# Patient Record
Sex: Male | Born: 1975 | Hispanic: Yes | Marital: Married
Health system: Southern US, Community
[De-identification: ages and names within clinical notes are randomized; demographics above are authoritative.]

## PROBLEM LIST (undated history)

## (undated) DIAGNOSIS — K921 Melena: Secondary | ICD-10-CM

## (undated) DIAGNOSIS — F329 Major depressive disorder, single episode, unspecified: Secondary | ICD-10-CM

## (undated) DIAGNOSIS — E538 Deficiency of other specified B group vitamins: Secondary | ICD-10-CM

## (undated) DIAGNOSIS — F32A Depression, unspecified: Secondary | ICD-10-CM

## (undated) DIAGNOSIS — A630 Anogenital (venereal) warts: Secondary | ICD-10-CM

## (undated) DIAGNOSIS — M459 Ankylosing spondylitis of unspecified sites in spine: Secondary | ICD-10-CM

## (undated) DIAGNOSIS — B019 Varicella without complication: Secondary | ICD-10-CM

## (undated) HISTORY — DX: Major depressive disorder, single episode, unspecified: F32.9

## (undated) HISTORY — DX: Anogenital (venereal) warts: A63.0

## (undated) HISTORY — DX: Deficiency of other specified B group vitamins: E53.8

## (undated) HISTORY — DX: Varicella without complication: B01.9

## (undated) HISTORY — DX: Ankylosing spondylitis of unspecified sites in spine: M45.9

## (undated) HISTORY — DX: Melena: K92.1

## (undated) HISTORY — DX: Depression, unspecified: F32.A

---

## 2007-12-26 ENCOUNTER — Emergency Department (HOSPITAL_COMMUNITY): Admission: EM | Admit: 2007-12-26 | Discharge: 2007-12-26 | Payer: Self-pay | Admitting: Emergency Medicine

## 2007-12-28 ENCOUNTER — Ambulatory Visit: Payer: Self-pay | Admitting: Cardiology

## 2007-12-28 ENCOUNTER — Ambulatory Visit: Payer: Self-pay

## 2007-12-29 ENCOUNTER — Ambulatory Visit: Payer: Self-pay

## 2008-01-30 ENCOUNTER — Ambulatory Visit: Payer: Self-pay | Admitting: Internal Medicine

## 2008-01-30 DIAGNOSIS — R030 Elevated blood-pressure reading, without diagnosis of hypertension: Secondary | ICD-10-CM | POA: Insufficient documentation

## 2008-01-30 DIAGNOSIS — F411 Generalized anxiety disorder: Secondary | ICD-10-CM | POA: Insufficient documentation

## 2008-01-30 DIAGNOSIS — M459 Ankylosing spondylitis of unspecified sites in spine: Secondary | ICD-10-CM

## 2008-01-30 DIAGNOSIS — H8110 Benign paroxysmal vertigo, unspecified ear: Secondary | ICD-10-CM

## 2008-01-30 DIAGNOSIS — F329 Major depressive disorder, single episode, unspecified: Secondary | ICD-10-CM

## 2008-01-30 DIAGNOSIS — F3289 Other specified depressive episodes: Secondary | ICD-10-CM | POA: Insufficient documentation

## 2008-02-14 ENCOUNTER — Encounter: Payer: Self-pay | Admitting: Internal Medicine

## 2008-02-14 ENCOUNTER — Encounter: Admission: RE | Admit: 2008-02-14 | Discharge: 2008-05-14 | Payer: Self-pay | Admitting: Internal Medicine

## 2008-04-08 ENCOUNTER — Ambulatory Visit: Payer: Self-pay | Admitting: Internal Medicine

## 2008-05-14 ENCOUNTER — Encounter: Payer: Self-pay | Admitting: Internal Medicine

## 2008-05-17 ENCOUNTER — Ambulatory Visit: Payer: Self-pay | Admitting: Cardiology

## 2008-08-08 ENCOUNTER — Ambulatory Visit: Payer: Self-pay | Admitting: Internal Medicine

## 2008-08-12 ENCOUNTER — Ambulatory Visit: Payer: Self-pay | Admitting: Internal Medicine

## 2008-08-12 LAB — CONVERTED CEMR LAB
ALT: 30 units/L (ref 0–53)
Bilirubin, Direct: 0.1 mg/dL (ref 0.0–0.3)
CO2: 30 meq/L (ref 19–32)
Calcium: 8.9 mg/dL (ref 8.4–10.5)
HDL: 41 mg/dL (ref >39.0)
Sodium: 143 meq/L (ref 135–145)
TSH: 2.85 microintl units/mL (ref 0.35–5.50)
Total Bilirubin: 0.7 mg/dL (ref 0.3–1.2)
Total CHOL/HDL Ratio: 3.9

## 2009-01-16 ENCOUNTER — Ambulatory Visit: Payer: Self-pay | Admitting: Internal Medicine

## 2009-01-16 DIAGNOSIS — B070 Plantar wart: Secondary | ICD-10-CM

## 2010-02-23 ENCOUNTER — Encounter: Payer: Self-pay | Admitting: Internal Medicine

## 2010-02-24 ENCOUNTER — Encounter: Payer: Self-pay | Admitting: Internal Medicine

## 2010-02-24 LAB — CONVERTED CEMR LAB
Cholesterol: 169 mg/dL (ref 0–200)
HDL: 64 mg/dL (ref >39)
Total CHOL/HDL Ratio: 2.6

## 2010-03-06 ENCOUNTER — Ambulatory Visit: Payer: Self-pay | Admitting: Internal Medicine

## 2010-03-06 DIAGNOSIS — E538 Deficiency of other specified B group vitamins: Secondary | ICD-10-CM

## 2010-03-06 DIAGNOSIS — L989 Disorder of the skin and subcutaneous tissue, unspecified: Secondary | ICD-10-CM | POA: Insufficient documentation

## 2010-03-06 DIAGNOSIS — R799 Abnormal finding of blood chemistry, unspecified: Secondary | ICD-10-CM

## 2010-03-31 LAB — CONVERTED CEMR LAB
BUN: 17 mg/dL (ref 6–23)
CO2: 25 meq/L (ref 19–32)
Calcium: 9.3 mg/dL (ref 8.4–10.5)
Creatinine, Ser: 0.96 mg/dL (ref 0.40–1.50)
Glucose, Bld: 85 mg/dL (ref 70–99)

## 2010-04-01 ENCOUNTER — Encounter: Payer: Self-pay | Admitting: Internal Medicine

## 2010-08-18 NOTE — Miscellaneous (Signed)
Summary: Lab Orders  Clinical Lists Changes  Orders: Added new Test order of T-Lipid Profile [phone]) - Signed Added new Test order of T-C-Reactive Protein [phone]) - Signed

## 2010-08-18 NOTE — Assessment & Plan Note (Signed)
Summary: mole on chest & high creatine reading at other office/dt   Vital Signs:  Patient profile:   35 year old male Weight:      194.25 pounds BMI:     27.19 Temp:     98.1 degrees F oral Pulse rate:   62 / minute Pulse rhythm:   regular Resp:     16 per minute BP sitting:   120 / 90  (right arm) Cuff size:   large  Vitals Entered By: Glendell Docker CMA (March 06, 2010 10:29 AM) CC: follow-up visit Comments lab work done through [facility] and was informed that his creatinine level was elevated and he was advised to stopped taking nsaids, had been drinking protein shakes prior to blood draw,     Primary Care Provider:  Dondra Spry DO  CC:  follow-up visit.  History of Present Illness: 35 y/o with hx of AS for f/u 2 weeks ago - rheum noted slightly elevated Cr. stopped taking NSAIDs in May drinks protein drinks after exercising also occ took stacker pills for wt loss     Allergies: No Known Drug Allergies  Past History:  Past Medical History: Ankylosing spondylitis Depression  History of hematochezia - negative workup including EGD and colonoscopy History of genital warts   Childhood chickenpox   ? hx of B12 deficiency  Family History: Mother is age 36-healthy Father is age 29-hypertension, ankylosing spondylitis, alcoholism brother and sister-both healthy denies family history of cancer PGM - diabetes     Review of Systems       occ loose stools, flatulence  Physical Exam  General:  alert, well-developed, and well-nourished.   Lungs:  normal respiratory effort and normal breath sounds.   Heart:  normal rate, regular rhythm, and no gallop.   Skin:  raised  2 mm hyperpigmented lesion right anterior shin, sebaceous cyst - underneath right breast Psych:  normally interactive and good eye contact.     Impression & Recommendations:  Problem # 1:  SKIN LESION (ICD-709.9)  Orders: Dermatology Referral (Derma)  Problem # 2:  OTHER NONSPECIFIC  FINDINGS EXAMINATION OF BLOOD (ICD-790.99) pt notes Cr higher when checked by rheumatologist I suspect Cr rise from protein supplements pt advised to stop all OTC supplements repeat Cr  Complete Medication List: 1)  Etodolac 400 Mg Tabs (Etodolac) .... Take 1 tablet by mouth once a day( hold) 2)  Omeprazole 20 Mg Cpdr (Omeprazole) .... Take 1 tablet by mouth once a day 3)  Osteo Bi-flex Adv Joint Shield Tabs (Misc natural products) .... Take 1 tablet by mouth two times a day 4)  Vitamin C 500 Mg Tabs (Ascorbic acid) .... Take 4 to 6 tablet by mouth once a day 5)  Clonazepam 0.5 Mg Tabs (Clonazepam) .... 1/2 tablet by mouth two times a day 6)  Methylprednisolone 4 Mg Tabs (Methylprednisolone) .... Take 1 tablet by mouth two times a day as needed 7)  Fish Oil 1000 Mg Caps (Omega-3 fatty acids) .... Take 2 capsule by mouth at bedtime 8)  Vitamin B-12 1000 Mcg Tabs (Cyanocobalamin) .... Take 1 tablet by mouth once a day  Other Orders: T-Vitamin B12 ([id number]) T-Basic Metabolic Panel ([id number])  Patient Instructions: 1)  Take probiotic - Accuflora or Align 2)  We will contact you re:  blood test results

## 2010-08-18 NOTE — Letter (Signed)
   [place] [facility] [address] [place], [place]  [address]  [place] Phone: [phone]      April 01, 2010   PINCHUS Cegielski [address] [place], [place] [address]  RE:  LAB RESULTS  Dear  Mr. CADDEN,  The following is an interpretation of your most recent lab tests.  Please take note of any instructions provided or changes to medications that have resulted from your lab work.  ELECTROLYTES:  Good - no changes needed    B12 levels - normal       Sincerely Yours,    Dr. Thomos Lemons  Appended Document:  mailed

## 2010-12-01 NOTE — Assessment & Plan Note (Signed)
[facility]                            CARDIOLOGY OFFICE NOTE   NAME:Barre, DELONTA                        MRN:          [id number]  DATE:05/17/2008                            DOB:          June 02, 1976    PRIMARY CARE PHYSICIAN:  Barbette Hair. Artist Pais, DO   REASON FOR PRESENTATION:  Evaluate the patient with chest pain and  palpitations.   HISTORY OF PRESENT ILLNESS:  I saw the patient back in June for the  above complaints.  At that time, I did send him for stress perfusion  study which demonstrated no evidence of ischemia or infarct.  There was  some mild anterior attenuation, which most likely represented artifact.  Since that time, he was found to have mold in his apartment.  He got out  of that situation so his symptoms have almost completely resolved.  He  has been active and able to do exercise without any symptoms.  He has a  little vertigo, which is being managed.  He has had no further  palpitations, presyncope, or syncope.  He has had no chest pain or  shortness of breath.   PAST MEDICAL HISTORY:  Adjustment disorder diagnosed in the [employer] (he had  a medical discharge), ankylosing spondylitis, borderline hypertension,  and previous panic attacks.   ALLERGIES:  Benzo lido cream.   REVIEW OF SYSTEMS:  As stated in the HPI and otherwise negative for  other systems.   PHYSICAL EXAMINATION:  GENERAL:  The patient is pleasant in no distress.  VITAL SIGNS:  Blood pressure 139/80, heart rate 53 and regular, weight  225 pounds, and body mass index 30.  NECK:  No jugular venous distention at 45 degrees; carotid upstroke  brisk and symmetrical; no bruits; no thyromegaly.  LYMPHATICS:  No  adenopathy.  LUNGS:  Clear to auscultation bilaterally.  BACK:  No costovertebral angle tenderness.  CHEST:  Unremarkable.  HEART:  PMI not displaced or sustained; S1 and S2 within normal limits;  no S3, no S4; no clicks, rubs, or murmurs.  ABDOMEN:  Obese;  positive  bowel sounds; normal in frequency and pitch; no bruits, rebound,  guarding, or midline pulsatile mass; no hepatomegaly; no splenomegaly.  SKIN:  No rashes; no nodules.  EXTREMITIES:  Pulses 2+ throughout; no edema, cyanosis, or clubbing.  NEURO:  Oriented to person, place, and time; cranial nerves II-XII  grossly intact; motor grossly intact.   ASSESSMENT AND PLAN:  1. Palpitations.  He is no longer problematic and may have been      related to the Mold in his apartment.  No further cardiovascular      testing is suggested.  2. Chest discomfort.  He had a negative stress perfusion study.  He is      no longer having any symptoms.  No further workup is planned.  3. Hypertension.  He has been keeping his blood pressure in check and      it has been well-controlled.  He is following Dr. Artist Pais.  4. Followup.  We will see the patient back as needed.  Rollene Rotunda, MD, [facility]  Electronically Signed    JH/MedQ  DD: 05/17/2008  DT: 05/17/2008  Job #: [id number]   cc:   Barbette Hair. Artist Pais, DO

## 2010-12-01 NOTE — Assessment & Plan Note (Signed)
[facility]                            CARDIOLOGY OFFICE NOTE   NAME:Ryan Cabrera, Ryan Cabrera                        MRN:          [id number]  DATE:12/28/2007                            DOB:          [birth year]    REFERRING PHYSICIAN:  Doug Sou, M.D.   REASON FOR CONSULTATION:  Evaluate patient with chest pain and  palpitations.   HISTORY OF PRESENT ILLNESS:  The patient is a pleasant 35 year old  gentleman with a past history of panic attacks, but no cardiac history.  He did report an ultrasound some years ago while in the [employer].  He  has had no further cardiovascular testing since that time or any  significant problems until Monday.  Monday, while playing the drums  (this appears to be very a aerobic activity when he does it),  he became  very short of breath.  He had chest discomfort, like someone sitting on  him.  This was moderately intense and lasted for about 2 minutes.  He  was already diaphoretic.  He did not become nauseated.  Later that day,  he felt quite fatigued and lightheaded.  He had had an episode like this  some years before.  All of Tuesday he felt weak.  He has had a couple of  episodes again since that time.  He had one on Tuesday of chest  tightness and feeling very anxious and having a sense of dread that  caused him to go to the ER.  There, he was noted to have negative  cardiac enzymes.  His EKG demonstrated some sinus arrhythmia, but no  other changes.  One set of point of care markers was negative.  He was  referred here for his cardiology appointment.   He said he felt dizzy all day yesterday.  He had one episode of his  heart beating hard today.  This lasted for about 90 seconds.  He had had  one more episode of chest tightness lasting for several seconds.  These  occur at rest, but he is not able to bring these on.  He is not sure  whether this is similar to his previous panic, but if it is, he thinks  it is more  intense.  He has been active, playing the drums and swimming  3 times a week, and has not been able to bring on these symptoms  recently.  Has not had any new shortness of breath, PND or orthopnea.  He is not having any presyncope or syncope.  He is not having any neck  or arm discomfort.  Has had no radiation of the current symptoms.  He  has had quite a bit of anxiety and was tearful in the office related to  this.   PAST MEDICAL HISTORY:  1. Adjustment disorder diagnosed in the [employer] (he had a medical      discharge).  2. Ankylosing spondylitis.  3. Borderline hypertension.  4. Previous panic attacks.   PAST SURGICAL HISTORY:  None.   ALLERGIES:  BENZYL LIDOCREAM.   MEDICATIONS:  Sulfasalazine 1000 mg b.i.d.   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  He is married and has one 93-year-old.  He recently moved  from [place] as his wife got a job here.  He smokes cigars.  He  quit smoking in 1998.  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  However, he has not done this since he was a teenager.  He drinks  alcohol socially.   FAMILY HISTORY:  Contributory for father having ankylosing spondylitis  and there being hypertension in both his parents.  Otherwise, there is  no family history of early coronary disease, arrhythmias, sudden death,  heart failure.   REVIEW OF SYSTEMS:  Positive for dizziness when he comes up from a yoga  pose.  He says he gets nauseated and feels quite bad for hours after  this.  Otherwise, as stated in the HPI, negative for all other systems.   PHYSICAL EXAMINATION:  The patient is in no distress but he is anxious.  Blood pressure 151/90, heart 77 regular, weight 220 pounds, body mass  index 30.  HEENT:  Eyelids unremarkable, pupils equal, round and reactive to light,  fundi not visualized, oral mucosa unremarkable.  NECK:  No jugular venous distention at 45 degrees, carotid upstroke  brisk  and symmetrical.  No bruits, no thyromegaly.  LYMPHATICS:  No cervical, axillary, or inguinal adenopathy.  LUNGS:  Clear to auscultation bilaterally.  BACK:  No costovertebral angle tenderness.  CHEST:  Unremarkable.  HEART:  PMI not displaced or sustained, S1-S2 within normal limits.  No  S3-S4. No clicks, no rubs, no murmurs.  ABDOMEN:  Mildly obese, positive bowel sounds normal in frequency and  pitch, no bruits, no rebound,  no guarding or midline pulsatile mass.  No hepatomegaly, no splenomegaly.  SKIN:  No rashes, no nodules.  EXTREMITIES:  2+ pulses throughout, no edema,  no cyanosis or clubbing.  NEURO:  Oriented to person, place, and time.  Cranial nerves II-XII  grossly intact, motor grossly intact.   ASSESSMENT/PLAN:  1. Chest pain:  The patient has symptoms that have atypical, but also      some typical features for unstable angina.  I think the pretest      probability is moderate.  Given this, screening with a stress      perfusion study is indicated.  I have described this to the patient      and he agrees to proceed.  2. Palpitations:  This is the other cardiac etiology that could cause      all the symptoms described.  He is going to wear a 2-week event      monitor.  When he comes back for a stress test, we will get a TSH      as well.  We did do orthostatics in the office and he did not drop      his blood pressure.  3. Anxiety:  I do think the patient has a component of this.  This can      be managed by his primary care physician.  Panic causing these      symptoms is a diagnosis of exclusion.  4. Hypertension.  Blood pressure is slightly elevated.  He can keep an      eye on this and we will check it going forward and have an      assessment of it when he does his stress test as well.  He will  need therapeutic lifestyle changes (TLC) to include some weight      loss to assist with this.  Blood pressure was lower when we      repeated it with the  orthostatics later during the office visit.  5. Weight.  The patient understands he  needs to lose weight with diet      and exercise.  6. Followup: I will see the patient back in 1 month or sooner if      needed.     Rollene Rotunda, MD, [facility]  Electronically Signed    JH/MedQ  DD: 12/28/2007  DT: 12/28/2007  Job #: [id number]   cc:   Doug Sou, M.D.

## 2010-12-01 NOTE — Assessment & Plan Note (Signed)
[facility]                                 ON-CALL NOTE   NAME:Ryan Cabrera, Ryan Cabrera                        MRN:          [id number]  DATE:12/26/2007                            DOB:          05-09-1976    TELEPHONE CONTACT NOTE:  Dated December 26, 2007   I was contacted by Dr. Ethelda Chick with the emergency room, stating that  he had Mr. [name] there who had had two episodes of chest pain  and shortness of breath, associated with exertion.  He stated that Mr.  [name] currently appears stable and his EKG as well as cardiac markers  were within normal limits.  He felt that Mr. [name] should be seen by  cardiology.  I advised him that I would be happy to arrange with our  office for him to be evaluated as an outpatient and Dr. Ethelda Chick felt  like that was an appropriate plan of care.  Of note, point of care  markers were negative times one and a CBC and BMET showed all values  within normal limits.  Additionally, a chest x-ray showed bibasilar  atelectasis, but no acute cardiopulmonary process.  A message has been  left with the office.      Theodore Demark, PA-C  Electronically Signed      Rollene Rotunda, MD, [facility]  Electronically Signed   RB/MedQ  DD: 12/26/2007  DT: 12/26/2007  Job #: [id number]

## 2010-12-04 NOTE — Letter (Signed)
January 04, 2008     To whom it may concern:   RE:  PRIYANSH, PRY  MRN:  [id number]  /  DOB:  03-09-1976   Mr. Say recently had an event monitor placed.  This was ordered to  evaluate palpitations.  However, he wore this monitor early, and I do  not plan on reviewing any of the strips.  He was not aware that this  would not be approved 100% coverage and cannot afford the monitor.  Therefore, I will not bill for reading.  The patient understands this.    Sincerely,      Rollene Rotunda, MD, [facility]  Electronically Signed    JH/MedQ  DD: 01/04/2008  DT: 01/05/2008  Job #: [id number]

## 2011-02-03 ENCOUNTER — Encounter: Payer: Self-pay | Admitting: Internal Medicine

## 2011-02-03 ENCOUNTER — Encounter: Payer: Self-pay | Admitting: Emergency Medicine

## 2011-02-03 ENCOUNTER — Inpatient Hospital Stay (INDEPENDENT_AMBULATORY_CARE_PROVIDER_SITE_OTHER)
Admission: RE | Admit: 2011-02-03 | Discharge: 2011-02-03 | Disposition: A | Discharge status: Home / Self Care | Payer: 59 | Source: Ambulatory Visit | Attending: Emergency Medicine | Admitting: Emergency Medicine

## 2011-02-03 DIAGNOSIS — H60339 Swimmer's ear, unspecified ear: Secondary | ICD-10-CM | POA: Insufficient documentation

## 2011-02-04 ENCOUNTER — Ambulatory Visit (INDEPENDENT_AMBULATORY_CARE_PROVIDER_SITE_OTHER): Payer: 59 | Admitting: Internal Medicine

## 2011-02-04 ENCOUNTER — Encounter: Payer: Self-pay | Admitting: Internal Medicine

## 2011-02-04 DIAGNOSIS — H60399 Other infective otitis externa, unspecified ear: Secondary | ICD-10-CM

## 2011-02-04 DIAGNOSIS — H6090 Unspecified otitis externa, unspecified ear: Secondary | ICD-10-CM

## 2011-02-04 DIAGNOSIS — T753XXA Motion sickness, initial encounter: Secondary | ICD-10-CM

## 2011-02-04 MED ORDER — SCOPOLAMINE 1 MG/3DAYS TD PT72: MEDICATED_PATCH | TRANSDERMAL | Status: DC

## 2011-02-04 MED ORDER — AMOXICILLIN 875 MG PO TABS: 875.0000 mg | ORAL_TABLET | Freq: Two times a day (BID) | ORAL | Status: DC

## 2011-02-04 MED ORDER — AMOXICILLIN 875 MG PO TABS: 875.0000 mg | ORAL_TABLET | Freq: Two times a day (BID) | ORAL | Status: AC

## 2011-02-04 MED ORDER — SCOPOLAMINE 1 MG/3DAYS TD PT72: MEDICATED_PATCH | TRANSDERMAL | Status: AC

## 2011-02-06 DIAGNOSIS — H6090 Unspecified otitis externa, unspecified ear: Secondary | ICD-10-CM | POA: Insufficient documentation

## 2011-02-06 DIAGNOSIS — T753XXA Motion sickness, initial encounter: Secondary | ICD-10-CM | POA: Insufficient documentation

## 2011-02-06 NOTE — Assessment & Plan Note (Signed)
Discussed prophylaxis. Provide scopolamine patches. Instructions for use provided.

## 2011-02-06 NOTE — Progress Notes (Signed)
  Subjective:    Patient ID: Ryan Cabrera, male    DOB: [birth year], 35 y.o.   MRN: [id number]  HPI patient presents to clinic to establish medical care and for evaluation of ear pain. This recently has had approximately one-week history of right ear pain without drainage injury or trauma. No associated fever or chills. Attempted to irrigate the ear at home without success or improvement. Presented to urgent care yesterday and diagnosed with otitis externa and was placed on antibiotic with steroid drops. Pain is moderate severity. No exacerbating or alleviating factors. Has intermittent vertigo and plans to take a cruise in approximately one month. Has attempted Dramamine in the past for seasickness with no improvement. Has ankylosing spondylitis controlled conservatively and followed by rheumatology. No other complaint.  Reviewed past medical history, past surgical history, medications, allergies, social history, family history    Review of Systems  Constitutional: Negative for fever and chills.  HENT: Positive for ear pain. Negative for facial swelling, neck pain and ear discharge.   All other systems reviewed and are negative.       Objective:   Physical Exam  Nursing note and vitals reviewed. Constitutional: He appears well-developed and well-nourished. No distress.  HENT:  Head: Normocephalic and atraumatic.  Right Ear: Hearing and tympanic membrane normal. No lacerations. There is swelling and tenderness. No foreign bodies.  Left Ear: Hearing, tympanic membrane, external ear and ear canal normal.  Nose: Nose normal.  Mouth/Throat: Oropharynx is clear and moist. No oropharyngeal exudate.       Edematous canal with mild white to yellow exudate  Eyes: Conjunctivae are normal. Right eye exhibits no discharge. Left eye exhibits no discharge. No scleral icterus.  Neck: Normal range of motion. Neck supple.  Neurological: He is alert.  Skin: Skin is warm and dry. He is not diaphoretic.    Psychiatric: He has a normal mood and affect. His behavior is normal.          Assessment & Plan:

## 2011-02-06 NOTE — Assessment & Plan Note (Signed)
Add oral antibiotic amoxicillin 875 mg b.i.d. For 7 days. Continue antibiotic steroid drops.Followup if no improvement or worsening.

## 2011-04-15 LAB — BASIC METABOLIC PANEL
BUN: 10
Calcium: 9.6
GFR calc non Af Amer: >60
Glucose, Bld: 90

## 2011-04-15 LAB — POCT CARDIAC MARKERS
CKMB, poc: 1.6
Operator id: 231701
Troponin i, poc: <0.05

## 2011-04-15 LAB — DIFFERENTIAL
Basophils Absolute: 0
Basophils Relative: 0
Eosinophils Relative: 0
Lymphocytes Relative: 24
Neutro Abs: 4.2

## 2011-04-15 LAB — CBC
Platelets: 185
RDW: 13

## 2011-06-18 ENCOUNTER — Encounter: Payer: Self-pay | Admitting: Internal Medicine

## 2011-06-18 ENCOUNTER — Ambulatory Visit (INDEPENDENT_AMBULATORY_CARE_PROVIDER_SITE_OTHER): Payer: 59 | Admitting: Internal Medicine

## 2011-06-18 VITALS — BP 120/60 | HR 46 | Temp 98.5°F | Resp 18 | Wt 186.0 lb

## 2011-06-18 DIAGNOSIS — H60399 Other infective otitis externa, unspecified ear: Secondary | ICD-10-CM

## 2011-06-18 DIAGNOSIS — H6090 Unspecified otitis externa, unspecified ear: Secondary | ICD-10-CM

## 2011-06-18 NOTE — Progress Notes (Signed)
  Subjective:    Patient ID: Ryan Cabrera, male    DOB: August 13, 1975, 35 y.o.   MRN: [id number]  HPI  Pt presents to clinic for evaluation of left ear pain. Notes several day h/o left ear pain without injury, f/c, or drainage. Has not felt sick. 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 Had cortisporin otic gtts leftover at home and attempted a few doses with improvement. Did recently change to new ear plugs which did not work well. No other complaints.  Past Medical History  Diagnosis Date  . Ankylosing spondylitis   . Depression   . Hematochezia     neg workup including EGD and colonoscopy  . Genital warts   . Chicken pox     childhood  . B12 deficiency     history of   No past surgical history on file.  reports that he has been smoking Cigars.  He has never used smokeless tobacco. He reports that he drinks alcohol. His drug history not on file. family history includes Alcohol abuse in his father; Ankylosing spondylitis in his father; Diabetes in his paternal grandmother; and Hypertension in his father.  There is no history of Cancer. Allergies  Allergen Reactions  . Molds & Smuts   . Sulfasalazine Other (See Comments)    Abdominal pain. Nausea  . Sulfonamide Derivatives      Review of Systems see hpi     Objective:   Physical Exam  Nursing note and vitals reviewed. Constitutional: He appears well-developed and well-nourished. No distress.  HENT:  Head: Normocephalic and atraumatic.  Right Ear: Tympanic membrane, external ear and ear canal normal.  Left Ear: Tympanic membrane and external ear normal.       Minimal left ear canal erythema  Eyes: Conjunctivae are normal. No scleral icterus.  Neurological: He is alert.  Skin: Skin is warm and dry. He is not diaphoretic.  Psychiatric: He has a normal mood and affect.          Assessment & Plan:

## 2011-06-18 NOTE — Assessment & Plan Note (Signed)
Minimal erythema. Findings not definitive for OE. Attempt cortisporin otic gtts for brief 3 day course. Followup if no improvement or worsening.

## 2011-06-21 NOTE — Progress Notes (Signed)
Summary: right ear ache (room 5)   Vital Signs:  Patient Profile:   35 Years Old Male CC:      right ear congestions/pain intermittant x 3 weeks Height:     71 inches Weight:      184 pounds O2 Sat:      99 % O2 treatment:    Room Air Temp:     98.8 degrees F oral Pulse rate:   49 / minute Resp:     16 per minute BP sitting:   128 / 72  Vitals Entered By: Lavell Islam RN (February 03, 2011 2:11 PM)                  Updated Prior Medication List: OMEPRAZOLE 20 MG CPDR (OMEPRAZOLE) Take 1 tablet by mouth once a day VITAMIN C 500 MG TABS (ASCORBIC ACID) Take 4 to 6 tablet by mouth once a day FISH OIL 1000 MG CAPS (OMEGA-3 FATTY ACIDS) Take 2 capsule by mouth at bedtime VITAMIN B-12 1000 MCG TABS (CYANOCOBALAMIN) Take 1 tablet by mouth once a day  Current Allergies: ! SULFA ! * MOLDSHistory of Present Illness Chief Complaint: right ear congestions/pain intermittant x 3 weeks History of Present Illness: R ear congestion, pain, intermittantly for 3 weeks.  He is an avid swimmer and swims in a pool 4x per week.  He also wears earphone when working out.  He has earbuds when he swims.  No F/C/N/V.  No URI symptoms, ST, runny nose, or other symptoms.  He tried OTC earwax drops but it didn't help.  REVIEW OF SYSTEMS Constitutional Symptoms      Denies fever, chills, night sweats, weight loss, weight gain, and fatigue.  Eyes       Denies change in vision, eye pain, eye discharge, glasses, contact lenses, and eye surgery. Ear/Nose/Throat/Mouth       Complains of ear pain.      Denies hearing loss/aids, change in hearing, ear discharge, dizziness, frequent runny nose, frequent nose bleeds, sinus problems, sore throat, hoarseness, and tooth pain or bleeding.      Comments: right Respiratory       Denies dry cough, productive cough, wheezing, shortness of breath, asthma, bronchitis, and emphysema/COPD.  Cardiovascular       Denies murmurs, chest pain, and tires easily with exhertion.     Gastrointestinal       Complains of diarrhea.      Denies stomach pain, nausea/vomiting, constipation, blood in bowel movements, and indigestion. Genitourniary       Denies painful urination, kidney stones, and loss of urinary control. Neurological       Denies paralysis, seizures, and fainting/blackouts. Musculoskeletal       Denies muscle pain, joint pain, joint stiffness, decreased range of motion, redness, swelling, muscle weakness, and gout.  Skin       Denies bruising, unusual mles/lumps or sores, and hair/skin or nail changes.  Psych       Denies mood changes, temper/anger issues, anxiety/stress, speech problems, depression, and sleep problems. 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 Ankylosing spondylitis Depression  History of hematochezia - negative workup including EGD and colonoscopy History of genital warts   Childhood chickenpox   ? hx of B12 deficiency  Family History: Reviewed history from 03/06/2010 and no changes required. Mother is age 6-healthy Father is age 72-hypertension, ankylosing spondylitis, alcoholism brother and sister-both healthy denies family history of cancer PGM - diabetes    Family History Diabetes  Social History: Reviewed history from 01/16/2009 and no changes required. 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 57 years old Alcohol use-yes (rare) Quit smoking in  1998-smoked for 9 years 1 1/2 packs per day    Physical Exam General appearance: well developed, well nourished, no acute distress Ears: R ear canal is swollen and erythematous, L ear normal other than minimal cerumen Nasal: mucosa pink, nonedematous, no septal deviation, turbinates normal Oral/Pharynx: tongue normal, posterior pharynx without erythema or exudate Chest/Lungs: no rales, wheezes, or rhonchi bilateral, breath sounds equal without effort Heart: regular rate and  rhythm, no murmur MSE: oriented to time, place, and person Assessment New Problems: ACUTE SWIMMERS EAR (ICD-380.12) FAMILY HISTORY DIABETES 1ST DEGREE RELATIVE (ICD-V18.0)   Plan New Medications/Changes: NEOMYCIN-POLYMYXIN-HC 3.5-10000-1 SOLN (NEOMYCIN-POLYMYXIN-HC) 4 gtts R ear two times a day for 1 week  #QS x 0, 02/03/2011, Hoyt Koch MD  New Orders: New Patient Level II [phone] Planning Comments:   Placed on ABX/HC eardrops.  Try to avoid swimming and Qtips and earphone this week until the ear is improved.  Follow-up with your primary care physician if not improving or if getting worse   The patient and/or caregiver has been counseled thoroughly with regard to medications prescribed including dosage, schedule, interactions, rationale for use, and possible side effects and they verbalize understanding.  Diagnoses and expected course of recovery discussed and will return if not improved as expected or if the condition worsens. Patient and/or caregiver verbalized understanding.  Prescriptions: NEOMYCIN-POLYMYXIN-HC 3.5-10000-1 SOLN (NEOMYCIN-POLYMYXIN-HC) 4 gtts R ear two times a day for 1 week  #QS x 0   Entered and Authorized by:   Hoyt Koch MD   Signed by:   Hoyt Koch MD on 02/03/2011   Method used:   Print then Give to Patient   RxID:   [id number]   Orders Added: 1)  New Patient Level II [52841]

## 2011-08-31 ENCOUNTER — Ambulatory Visit (INDEPENDENT_AMBULATORY_CARE_PROVIDER_SITE_OTHER): Payer: 59 | Admitting: Internal Medicine

## 2011-08-31 ENCOUNTER — Encounter: Payer: Self-pay | Admitting: Internal Medicine

## 2011-08-31 VITALS — BP 100/70 | HR 45 | Temp 98.4°F | Resp 16 | Ht 71.0 in | Wt 181.0 lb

## 2011-08-31 DIAGNOSIS — R102 Pelvic and perineal pain: Secondary | ICD-10-CM

## 2011-08-31 DIAGNOSIS — R300 Dysuria: Secondary | ICD-10-CM

## 2011-08-31 MED ORDER — CIPROFLOXACIN HCL 500 MG PO TABS: 500.0000 mg | ORAL_TABLET | Freq: Two times a day (BID) | ORAL | Status: AC

## 2011-08-31 MED ORDER — CLOTRIMAZOLE 1 % EX CREA: TOPICAL_CREAM | Freq: Two times a day (BID) | CUTANEOUS | Status: AC

## 2011-08-31 NOTE — Progress Notes (Signed)
  Subjective:    Patient ID: Ryan Cabrera, male    DOB: 05/12/1976, 36 y.o.   MRN: [id number]  HPI Pt presents to clinic for evaluation of GU pain. Approximately 1.5 wks ago while masturbating developed pain located ultimately in penile shaft, testicles and perineal area. Had mild dysuria. No fever, chills or urethral discharge. Sx's have improved spontaneously. Monogamous with wife. Also notes area of slight darkening along penile shaft without itching or skin lesion. No other alleviating or exacerbating factors.  Past Medical History  Diagnosis Date  . Ankylosing spondylitis   . Depression   . Hematochezia     neg workup including EGD and colonoscopy  . Genital warts   . Chicken pox     childhood  . B12 deficiency     history of   No past surgical history on file.  reports that he has been smoking Cigars.  He has never used smokeless tobacco. He reports that he drinks alcohol. His drug history not on file. family history includes Alcohol abuse in his father; Ankylosing spondylitis in his father; Diabetes in his paternal grandmother; and Hypertension in his father.  There is no history of Cancer. Allergies  Allergen Reactions  . Molds & Smuts   . Sulfasalazine Other (See Comments)    Abdominal pain. Nausea  . Sulfonamide Derivatives      Review of Systems see hpi     Objective:   Physical Exam  Nursing note and vitals reviewed. Constitutional: He appears well-developed and well-nourished. No distress.  HENT:  Head: Normocephalic and atraumatic.  Right Ear: External ear normal.  Left Ear: External ear normal.  Genitourinary:       No testicular mass or tenderness. No penile shaft tenderness or dc. Slight darkening of shaft without skin lesion  Skin: Skin is warm and dry. He is not diaphoretic.          Assessment & Plan:

## 2011-09-01 LAB — URINALYSIS, ROUTINE W REFLEX MICROSCOPIC
Bilirubin Urine: NEGATIVE
Hgb urine dipstick: NEGATIVE
Ketones, ur: NEGATIVE mg/dL
Nitrite: NEGATIVE
pH: 7 (ref 5.0–8.0)

## 2011-09-05 DIAGNOSIS — R102 Pelvic and perineal pain: Secondary | ICD-10-CM | POA: Insufficient documentation

## 2011-09-05 NOTE — Assessment & Plan Note (Signed)
Obtain ua. Attempt coure of cipro. Clotrimazole for penile skin. Followup if no improvement or worsening.

## 2012-03-16 ENCOUNTER — Ambulatory Visit (HOSPITAL_BASED_OUTPATIENT_CLINIC_OR_DEPARTMENT_OTHER)
Admission: RE | Admit: 2012-03-16 | Discharge: 2012-03-16 | Disposition: A | Discharge status: Home / Self Care | Payer: 59 | Source: Ambulatory Visit | Attending: Internal Medicine | Admitting: Internal Medicine

## 2012-03-16 ENCOUNTER — Ambulatory Visit (INDEPENDENT_AMBULATORY_CARE_PROVIDER_SITE_OTHER): Payer: 59 | Admitting: Internal Medicine

## 2012-03-16 ENCOUNTER — Encounter: Payer: Self-pay | Admitting: Internal Medicine

## 2012-03-16 VITALS — BP 110/72 | HR 50 | Temp 98.3°F | Resp 16 | Wt 183.8 lb

## 2012-03-16 DIAGNOSIS — Z79899 Other long term (current) drug therapy: Secondary | ICD-10-CM

## 2012-03-16 DIAGNOSIS — M25519 Pain in unspecified shoulder: Secondary | ICD-10-CM | POA: Insufficient documentation

## 2012-03-16 DIAGNOSIS — H6120 Impacted cerumen, unspecified ear: Secondary | ICD-10-CM

## 2012-03-16 LAB — BASIC METABOLIC PANEL
BUN: 19 mg/dL (ref 6–23)
Chloride: 104 mEq/L (ref 96–112)
Creat: 0.86 mg/dL (ref 0.50–1.35)
Glucose, Bld: 49 mg/dL — ABNORMAL LOW (ref 70–99)

## 2012-03-16 LAB — HEPATIC FUNCTION PANEL
ALT: 28 U/L (ref 0–53)
AST: 25 U/L (ref 0–37)
Albumin: 4.3 g/dL (ref 3.5–5.2)
Bilirubin, Direct: 0.1 mg/dL (ref 0.0–0.3)
Total Protein: 6.9 g/dL (ref 6.0–8.3)

## 2012-03-16 MED ORDER — DICLOFENAC SODIUM 75 MG PO TBEC: DELAYED_RELEASE_TABLET | ORAL | Status: AC

## 2012-03-23 ENCOUNTER — Telehealth: Payer: Self-pay | Admitting: Internal Medicine

## 2012-03-23 NOTE — Telephone Encounter (Signed)
Spoke with the nurse and was told his blood sugar was low.  Wants to know what Dr Rodena Medin thinks he should do.

## 2012-03-23 NOTE — Telephone Encounter (Signed)
It was a single glucose only. Very unlikely to represent a true medical problem. If worries him then schedule nurse visit for fasting fingerstick glucose

## 2012-03-23 NOTE — Telephone Encounter (Signed)
Nurse visit scheduled 09.06.13 at 9:15 am for Fasting fingerstick glucose check/SLS

## 2012-03-24 ENCOUNTER — Other Ambulatory Visit: Payer: 59 | Admitting: *Deleted

## 2012-03-26 DIAGNOSIS — M25519 Pain in unspecified shoulder: Secondary | ICD-10-CM | POA: Insufficient documentation

## 2012-03-26 DIAGNOSIS — H6120 Impacted cerumen, unspecified ear: Secondary | ICD-10-CM | POA: Insufficient documentation

## 2012-03-26 DIAGNOSIS — Z79899 Other long term (current) drug therapy: Secondary | ICD-10-CM | POA: Insufficient documentation

## 2012-03-26 NOTE — Assessment & Plan Note (Signed)
Partial. Attempt otc gtts. Followup if no improvement or worsening.

## 2012-03-26 NOTE — Progress Notes (Signed)
  Subjective:    Patient ID: Ryan Cabrera, male    DOB: 11-21-1975, 36 y.o.   MRN: [id number]  HPI Pt presents to clinic for evaluation of shoulder pain. Notes 1+year h/o left shoulder pain now progressively worsening. Has decreased abduction. No injury. Also notes left ear pain without drainage.  Past Medical History  Diagnosis Date  . Ankylosing spondylitis   . Depression   . Hematochezia     neg workup including EGD and colonoscopy  . Genital warts   . Chicken pox     childhood  . B12 deficiency     history of   No past surgical history on file.  reports that he has been passively smoking Cigars.  He has never used smokeless tobacco. He reports that he drinks alcohol. His drug history not on file. family history includes Alcohol abuse in his father; Ankylosing spondylitis in his father; Diabetes in his paternal grandmother; and Hypertension in his father.  There is no history of Cancer. Allergies  Allergen Reactions  . Molds & Smuts   . Sulfasalazine Other (See Comments)    Abdominal pain. Nausea  . Sulfonamide Derivatives      Review of Systems see hpi     Objective:   Physical Exam  Nursing note and vitals reviewed. Constitutional: He appears well-developed and well-nourished. No distress.  HENT:  Head: Normocephalic and atraumatic.  Right Ear: Tympanic membrane and ear canal normal.       Left ear partial cerumen obstruction.  Musculoskeletal:       Left shoulder-no erythema, warmth, effusion. Decreased ROM with reproduced pain against resistance. No crepitus  Neurological: He is alert.  Skin: He is not diaphoretic.  Psychiatric: He has a normal mood and affect.          Assessment & Plan:

## 2012-03-26 NOTE — Assessment & Plan Note (Signed)
Attempt voltaren with food and no other nsaids. Schedule PT. Plain xray of left shoulder. Followup if no improvement or worsening.

## 2012-03-26 NOTE — Assessment & Plan Note (Signed)
Obtain chem7/lft

## 2012-03-28 ENCOUNTER — Ambulatory Visit: Payer: 59 | Attending: Internal Medicine | Admitting: Physical Therapy

## 2012-03-28 DIAGNOSIS — IMO0001 Reserved for inherently not codable concepts without codable children: Secondary | ICD-10-CM | POA: Insufficient documentation

## 2012-03-28 DIAGNOSIS — M25619 Stiffness of unspecified shoulder, not elsewhere classified: Secondary | ICD-10-CM | POA: Insufficient documentation

## 2012-03-28 DIAGNOSIS — M25519 Pain in unspecified shoulder: Secondary | ICD-10-CM | POA: Insufficient documentation

## 2012-03-29 ENCOUNTER — Ambulatory Visit: Payer: 59 | Admitting: Physical Therapy

## 2012-04-04 ENCOUNTER — Ambulatory Visit: Payer: 59 | Admitting: Physical Therapy

## 2012-04-06 ENCOUNTER — Ambulatory Visit: Payer: 59 | Admitting: Physical Therapy

## 2012-04-11 ENCOUNTER — Ambulatory Visit: Payer: 59 | Admitting: Physical Therapy

## 2012-04-13 ENCOUNTER — Ambulatory Visit: Payer: 59 | Admitting: Physical Therapy

## 2012-04-18 ENCOUNTER — Ambulatory Visit: Payer: 59 | Attending: Internal Medicine | Admitting: Physical Therapy

## 2012-04-18 DIAGNOSIS — M25619 Stiffness of unspecified shoulder, not elsewhere classified: Secondary | ICD-10-CM | POA: Insufficient documentation

## 2012-04-18 DIAGNOSIS — M25519 Pain in unspecified shoulder: Secondary | ICD-10-CM | POA: Insufficient documentation

## 2012-04-18 DIAGNOSIS — IMO0001 Reserved for inherently not codable concepts without codable children: Secondary | ICD-10-CM | POA: Insufficient documentation

## 2012-04-20 ENCOUNTER — Ambulatory Visit: Payer: 59 | Admitting: Physical Therapy

## 2012-04-25 ENCOUNTER — Ambulatory Visit: Payer: 59 | Admitting: Physical Therapy

## 2012-04-26 ENCOUNTER — Ambulatory Visit: Payer: 59 | Admitting: Physical Therapy

## 2012-04-27 ENCOUNTER — Encounter: Payer: 59 | Admitting: Physical Therapy

## 2012-05-02 ENCOUNTER — Ambulatory Visit: Payer: 59 | Admitting: Physical Therapy

## 2012-05-09 ENCOUNTER — Ambulatory Visit: Payer: 59 | Admitting: Physical Therapy

## 2012-05-16 ENCOUNTER — Ambulatory Visit: Payer: 59 | Admitting: Physical Therapy

## 2012-05-23 ENCOUNTER — Ambulatory Visit: Payer: 59 | Attending: Internal Medicine | Admitting: Physical Therapy

## 2012-05-23 DIAGNOSIS — IMO0001 Reserved for inherently not codable concepts without codable children: Secondary | ICD-10-CM | POA: Insufficient documentation

## 2012-05-23 DIAGNOSIS — M25619 Stiffness of unspecified shoulder, not elsewhere classified: Secondary | ICD-10-CM | POA: Insufficient documentation

## 2012-05-23 DIAGNOSIS — M25519 Pain in unspecified shoulder: Secondary | ICD-10-CM | POA: Insufficient documentation

## 2012-05-30 ENCOUNTER — Ambulatory Visit: Payer: 59 | Admitting: Physical Therapy

## 2012-06-06 ENCOUNTER — Encounter: Payer: 59 | Admitting: Physical Therapy

## 2012-06-13 ENCOUNTER — Ambulatory Visit: Payer: 59 | Admitting: Physical Therapy

## 2014-01-08 ENCOUNTER — Emergency Department (INDEPENDENT_AMBULATORY_CARE_PROVIDER_SITE_OTHER)
Admission: EM | Admit: 2014-01-08 | Discharge: 2014-01-08 | Disposition: A | Discharge status: Home / Self Care | Payer: 59 | Source: Home / Self Care

## 2014-01-08 ENCOUNTER — Encounter: Payer: Self-pay | Admitting: Emergency Medicine

## 2014-01-08 DIAGNOSIS — W57XXXA Bitten or stung by nonvenomous insect and other nonvenomous arthropods, initial encounter: Secondary | ICD-10-CM

## 2014-01-08 DIAGNOSIS — T148 Other injury of unspecified body region: Secondary | ICD-10-CM

## 2014-01-08 MED ORDER — DOXYCYCLINE HYCLATE 100 MG PO TABS: 100.0000 mg | ORAL_TABLET | Freq: Two times a day (BID) | ORAL | Status: AC

## 2014-01-08 NOTE — ED Notes (Signed)
Pt c/o tick bite to his RT foot x 1 wk ago, with some redness, aching in his toes, and tender to touch. Denies fever or rash.

## 2014-01-08 NOTE — ED Provider Notes (Signed)
CSN: [id number]     Arrival date & time 01/08/14  1237 History   None    Chief Complaint  Patient presents with  . Insect Bite   (Consider location/radiation/quality/duration/timing/severity/associated sxs/prior Treatment) The history is provided by the patient.  Pt is a 38 yo male who presents to the clinic with tick bite 7 days ago of this right foot in between his 2nd and 3rd toe. He pulled tick off himself. Was painful. There is tenderness and redness that persists. He has felt weak, fatigue and aching all over but mostly in his right foot area. He does have dx of ankylosing spondylitis that leaves him feeling this way a lot. He denies any fever, chills, n/v/d. No abdominal pain. Not tried anything to make better except to pull tick off.  Past Medical History  Diagnosis Date  . Ankylosing spondylitis   . Depression   . Hematochezia     neg workup including EGD and colonoscopy  . Genital warts   . Chicken pox     childhood  . B12 deficiency     history of   History reviewed. No pertinent past surgical history. Family History  Problem Relation Age of Onset  . Ankylosing spondylitis Father   . Alcohol abuse Father   . Hypertension Father   . Diabetes Paternal Grandmother   . Cancer Neg Hx    History  Substance Use Topics  . Smoking status: Passive Smoke Exposure - Never Smoker    Types: Cigars    Last Attempt to Quit: 07/19/1996  . Smokeless tobacco: Never Used     Comment: smoked for 9 yrs 1.5 packs per day. 1 cigar weekly.  . Alcohol Use: Yes     Comment: 2-7 drinks weekly    Review of Systems  All other systems reviewed and are negative.   Allergies  Molds & smuts; Sulfasalazine; and Sulfonamide derivatives  Home Medications   Prior to Admission medications   Medication Sig Start Date End Date Taking? Authorizing Provider  clonazePAM (KLONOPIN) 0.5 MG tablet Take 0.5 mg by mouth as needed.      Historical Provider, MD  doxycycline (VIBRA-TABS) 100 MG tablet  Take 1 tablet (100 mg total) by mouth 2 (two) times daily. For 14 days. 01/08/14 01/15/14  Jovany Disano L Tenlee Wollin, PA-C  ETODOLAC PO Take by mouth. As needed for ankylosing spondilitis.     Historical Provider, MD  fish oil-omega-3 fatty acids 1000 MG capsule Take 2 tablets daily.    Historical Provider, MD  glucosamine-chondroitin 500-400 MG tablet Take 2 tablets daily.     Historical Provider, MD  ibuprofen (ADVIL,MOTRIN) 200 MG tablet Take 200 mg by mouth every 6 (six) hours as needed.      Historical Provider, MD  methylPREDNIsolone (MEDROL DOSPACK) 4 MG tablet Take by mouth as directed. As needed for ankylosing spondilitis     Historical Provider, MD  Multiple Vitamin (MULTIVITAMIN) tablet Take 1 tablet by mouth daily.      Historical Provider, MD  neomycin-polymyxin-hydrocortisone (CORTISPORIN) otic solution Place 3 drops into the left ear 4 (four) times daily.      Historical Provider, MD  omeprazole (PRILOSEC) 20 MG capsule Take 20 mg by mouth daily.      Historical Provider, MD  vitamin B-12 (CYANOCOBALAMIN) 1000 MCG tablet Take 1,000 mcg by mouth daily.      Historical Provider, MD  vitamin C (ASCORBIC ACID) 500 MG tablet Take by mouth daily. Take 4-6 tab po daily  Historical Provider, MD   BP 133/78  Pulse 50  Temp(Src) 98.9 F (37.2 C) (Oral)  Resp 16  Wt 177 lb (80.287 kg)  SpO2 100% Physical Exam  Constitutional: He is oriented to person, place, and time. He appears well-developed and well-nourished.  HENT:  Head: Normocephalic and atraumatic.  Cardiovascular: Normal rate, regular rhythm and normal heart sounds.   Musculoskeletal:       Feet:  Neurological: He is alert and oriented to person, place, and time.  Psychiatric: He has a normal mood and affect. His behavior is normal.    ED Course  Procedures (including critical care time) Labs Review Labs Reviewed  ROCKY MTN SPOTTED FVR AB, IGG-BLOOD  ROCKY MTN SPOTTED FVR AB, IGM-BLOOD  B. BURGDORFI ANTIBODIES BY WB  CBC  WITH DIFFERENTIAL    Imaging Review No results found.   MDM   1. Tick bite    Labs drawn to evaluate pathology of tick. CBC, rocky mtn spotted fever, lymes.  Pt is having active arthralgias of right foot and general feeling of weakness and fatigue.  I am going to treat with doxycycline.  Discussed tick prevention.  Will call with results follow up as needed.      Donella Stade, PA-C 01/08/14 1437

## 2014-01-08 NOTE — Discharge Instructions (Signed)
Tick Bite Information Ticks are insects that attach themselves to the skin and draw blood for food. There are various types of ticks. Common types include wood ticks and deer ticks. Most ticks live in shrubs and grassy areas. Ticks can climb onto your body when you make contact with leaves or grass where the tick is waiting. The most common places on the body for ticks to attach themselves are the scalp, neck, armpits, waist, and groin. Most tick bites are harmless, but sometimes ticks carry germs that cause diseases. These germs can be spread to a person during the tick's feeding process. The chance of a disease spreading through a tick bite depends on:   The type of tick.  Time of year.   How long the tick is attached.   Geographic location.  HOW CAN YOU PREVENT TICK BITES? Take these steps to help prevent tick bites when you are outdoors:  Wear protective clothing. Long sleeves and long pants are best.   Wear white clothes so you can see ticks more easily.  Tuck your pant legs into your socks.   If walking on a trail, stay in the middle of the trail to avoid brushing against bushes.  Avoid walking through areas with long grass.  Put insect repellent on all exposed skin and along boot tops, pant legs, and sleeve cuffs.   Check clothing, hair, and skin repeatedly and before going inside.   Brush off any ticks that are not attached.  Take a shower or bath as soon as possible after being outdoors.  WHAT IS THE PROPER WAY TO REMOVE A TICK? Ticks should be removed as soon as possible to help prevent diseases caused by tick bites. 1. If latex gloves are available, put them on before trying to remove a tick.  2. Using fine-point tweezers, grasp the tick as close to the skin as possible. You may also use curved forceps or a tick removal tool. Grasp the tick as close to its head as possible. Avoid grasping the tick on its body. 3. Pull gently with steady upward pressure until  the tick lets go. Do not twist the tick or jerk it suddenly. This may break off the tick's head or mouth parts. 4. Do not squeeze or crush the tick's body. This could force disease-carrying fluids from the tick into your body.  5. After the tick is removed, wash the bite area and your hands with soap and water or other disinfectant such as alcohol. 6. Apply a small amount of antiseptic cream or ointment to the bite site.  7. Wash and disinfect any instruments that were used.  Do not try to remove a tick by applying a hot match, petroleum jelly, or fingernail polish to the tick. These methods do not work and may increase the chances of disease being spread from the tick bite.  WHEN SHOULD YOU SEEK MEDICAL CARE? Contact your health care provider if you are unable to remove a tick from your skin or if a part of the tick breaks off and is stuck in the skin.  After a tick bite, you need to be aware of signs and symptoms that could be related to diseases spread by ticks. Contact your health care provider if you develop any of the following in the days or weeks after the tick bite:  Unexplained fever.  Rash. A circular rash that appears days or weeks after the tick bite may indicate the possibility of Lyme disease. The rash may resemble   a target with a bull's-eye and may occur at a different part of your body than the tick bite.  Redness and swelling in the area of the tick bite.   Tender, swollen lymph glands.   Diarrhea.   Weight loss.   Cough.   Fatigue.   Muscle, joint, or bone pain.   Abdominal pain.   Headache.   Lethargy or a change in your level of consciousness.  Difficulty walking or moving your legs.   Numbness in the legs.   Paralysis.  Shortness of breath.   Confusion.   Repeated vomiting.  Document Released: 07/02/2000 Document Revised: 04/25/2013 Document Reviewed: 12/13/2012 [employer] Patient Information 2015 [employer]. This information is  not intended to replace advice given to you by your health care provider. Make sure you discuss any questions you have with your health care provider.  

## 2014-01-09 LAB — CBC WITH DIFFERENTIAL/PLATELET
BASOS PCT: 0 % (ref 0–1)
Basophils Absolute: 0 10*3/uL (ref 0.0–0.1)
Eosinophils Absolute: 0.1 10*3/uL (ref 0.0–0.7)
Eosinophils Relative: 1 % (ref 0–5)
HCT: 43.2 % (ref 39.0–52.0)
HEMOGLOBIN: 14.8 g/dL (ref 13.0–17.0)
LYMPHS ABS: 1.3 10*3/uL (ref 0.7–4.0)
LYMPHS PCT: 17 % (ref 12–46)
MCH: 30.4 pg (ref 26.0–34.0)
MCHC: 34.3 g/dL (ref 30.0–36.0)
MCV: 88.7 fL (ref 78.0–100.0)
MONOS PCT: 5 % (ref 3–12)
Monocytes Absolute: 0.4 10*3/uL (ref 0.1–1.0)
NEUTROS ABS: 6 10*3/uL (ref 1.7–7.7)
NEUTROS PCT: 77 % (ref 43–77)
Platelets: 275 10*3/uL (ref 150–400)
RBC: 4.87 MIL/uL (ref 4.22–5.81)
RDW: 13.6 % (ref 11.5–15.5)
WBC: 7.8 10*3/uL (ref 4.0–10.5)

## 2014-01-09 LAB — B. BURGDORFI ANTIBODIES BY WB
B BURGDORFERI IGG ABS (IB): NEGATIVE
B BURGDORFERI IGM ABS (IB): NEGATIVE

## 2014-01-09 LAB — ROCKY MTN SPOTTED FVR AB, IGM-BLOOD: ROCKY MTN SPOTTED FEVER, IGM: 0.23 IV

## 2014-01-09 LAB — ROCKY MTN SPOTTED FVR AB, IGG-BLOOD: RMSF IgG: 0.71 IV

## 2014-01-09 NOTE — ED Provider Notes (Signed)
Agree with exam, assessment, and plan.   Kandra Nicolas, MD 01/09/14 [phone]

## 2014-01-16 ENCOUNTER — Telehealth: Payer: Self-pay | Admitting: *Deleted

## 2014-05-22 ENCOUNTER — Emergency Department (INDEPENDENT_AMBULATORY_CARE_PROVIDER_SITE_OTHER): Payer: 59

## 2014-05-22 ENCOUNTER — Encounter: Payer: Self-pay | Admitting: *Deleted

## 2014-05-22 ENCOUNTER — Emergency Department
Admission: EM | Admit: 2014-05-22 | Discharge: 2014-05-22 | Disposition: A | Discharge status: Home / Self Care | Payer: 59 | Source: Home / Self Care | Attending: Emergency Medicine | Admitting: Emergency Medicine

## 2014-05-22 DIAGNOSIS — M7731 Calcaneal spur, right foot: Secondary | ICD-10-CM

## 2014-05-22 DIAGNOSIS — S92421A Displaced fracture of distal phalanx of right great toe, initial encounter for closed fracture: Secondary | ICD-10-CM

## 2014-05-22 DIAGNOSIS — T1490XA Injury, unspecified, initial encounter: Secondary | ICD-10-CM

## 2014-05-22 DIAGNOSIS — X58XXXA Exposure to other specified factors, initial encounter: Secondary | ICD-10-CM

## 2014-05-22 DIAGNOSIS — S92401A Displaced unspecified fracture of right great toe, initial encounter for closed fracture: Secondary | ICD-10-CM

## 2014-05-22 DIAGNOSIS — S92501A Displaced unspecified fracture of right lesser toe(s), initial encounter for closed fracture: Secondary | ICD-10-CM

## 2014-05-22 DIAGNOSIS — Z23 Encounter for immunization: Secondary | ICD-10-CM

## 2014-05-22 DIAGNOSIS — S90414A Abrasion, right lesser toe(s), initial encounter: Secondary | ICD-10-CM

## 2014-05-22 DIAGNOSIS — Y93B9 Activity, other involving muscle strengthening exercises: Secondary | ICD-10-CM

## 2014-05-22 MED ORDER — TETANUS-DIPHTH-ACELL PERTUSSIS 5-2.5-18.5 LF-MCG/0.5 IM SUSP
0.5000 mL | Freq: Once | INTRAMUSCULAR | Status: AC
  Administered 2014-05-22: 0.5 mL via INTRAMUSCULAR

## 2014-05-22 MED ORDER — MELOXICAM 7.5 MG PO TABS: ORAL_TABLET | ORAL | Status: AC

## 2014-05-22 MED ORDER — CEPHALEXIN 500 MG PO CAPS: 500.0000 mg | ORAL_CAPSULE | Freq: Two times a day (BID) | ORAL | Status: DC

## 2014-05-22 MED ORDER — HYDROCODONE-ACETAMINOPHEN 5-325 MG PO TABS: 1.0000 | ORAL_TABLET | ORAL | Status: DC | PRN

## 2014-05-22 NOTE — ED Notes (Signed)
Ryan Cabrera states that he dropped a 45 lb weight on his RT foot, primarily his first 3 toes. He has an abrasion to his RT great toe.

## 2014-05-22 NOTE — ED Provider Notes (Signed)
CSN: [id number]     Arrival date & time 05/22/14  1834 History   First MD Initiated Contact with Patient 05/22/14 1848     Chief Complaint  Patient presents with  . Foot Injury   (Consider location/radiation/quality/duration/timing/severity/associated sxs/prior Treatment) HPI Ryan Cabrera states that while at the gym one hour ago, he accidentally dropped a 45 lb weight on his RT foot, primarily his first 3 toes. He has an abrasion to his RT great toe.  Pain right forefoot is severe, sharp, worse was trying to walk. No radiation or paresthesias. No nausea or vomiting or chest pain or shortness of breath. Last tetanus shot not known for certain.   Past Medical History  Diagnosis Date  . Ankylosing spondylitis   . Depression   . Hematochezia     neg workup including EGD and colonoscopy  . Genital warts   . Chicken pox     childhood  . B12 deficiency     history of   History reviewed. No pertinent past surgical history. Family History  Problem Relation Age of Onset  . Ankylosing spondylitis Father   . Alcohol abuse Father   . Hypertension Father   . Diabetes Paternal Grandmother   . Cancer Neg Hx    History  Substance Use Topics  . Smoking status: Passive Smoke Exposure - Never Smoker    Types: Cigars    Last Attempt to Quit: 07/19/1996  . Smokeless tobacco: Never Used     Comment: smoked for 9 yrs 1.5 packs per day. 1 cigar weekly.  . Alcohol Use: Yes     Comment: 2-7 drinks weekly    Review of Systems  All other systems reviewed and are negative.   Allergies  Molds & smuts; Sulfasalazine; and Sulfonamide derivatives  Home Medications   Prior to Admission medications   Medication Sig Start Date End Date Taking? Authorizing Provider  cephALEXin (KEFLEX) 500 MG capsule Take 1 capsule (500 mg total) by mouth 2 (two) times daily. 05/22/14   Jacqulyn Cane, MD  clonazePAM (KLONOPIN) 0.5 MG tablet Take 0.5 mg by mouth as needed.      Historical Provider, MD  ETODOLAC PO  Take by mouth. As needed for ankylosing spondilitis.     Historical Provider, MD  fish oil-omega-3 fatty acids 1000 MG capsule Take 2 tablets daily.    Historical Provider, MD  glucosamine-chondroitin 500-400 MG tablet Take 2 tablets daily.     Historical Provider, MD  HYDROcodone-acetaminophen (NORCO/VICODIN) 5-325 MG per tablet Take 1-2 tablets by mouth every 4 (four) hours as needed for severe pain. Take with food. 05/22/14   Jacqulyn Cane, MD  ibuprofen (ADVIL,MOTRIN) 200 MG tablet Take 200 mg by mouth every 6 (six) hours as needed.      Historical Provider, MD  meloxicam (MOBIC) 7.5 MG tablet Take 1 twice a day as needed for pain. Take with food. (Do not take with any other NSAID.) 05/22/14   Jacqulyn Cane, MD  Multiple Vitamin (MULTIVITAMIN) tablet Take 1 tablet by mouth daily.      Historical Provider, MD  neomycin-polymyxin-hydrocortisone (CORTISPORIN) otic solution Place 3 drops into the left ear 4 (four) times daily.      Historical Provider, MD  omeprazole (PRILOSEC) 20 MG capsule Take 20 mg by mouth daily.      Historical Provider, MD  vitamin B-12 (CYANOCOBALAMIN) 1000 MCG tablet Take 1,000 mcg by mouth daily.      Historical Provider, MD  vitamin C (ASCORBIC ACID) 500 MG tablet  Take by mouth daily. Take 4-6 tab po daily     Historical Provider, MD   BP 149/97 mmHg  Pulse 55  Temp(Src) 98.3 F (36.8 C) (Oral)  Resp 16  SpO2 100% Physical Exam  Constitutional: He is oriented to person, place, and time. He appears well-developed and well-nourished. No distress.  Very uncomfortable from right forefoot injury. He walks on heel to avoid pain.  HENT:  Head: Normocephalic and atraumatic.  Eyes: Conjunctivae and EOM are normal. Pupils are equal, round, and reactive to light. No scleral icterus.  Neck: Normal range of motion.  Cardiovascular: Normal rate.   Pulmonary/Chest: Effort normal.  Abdominal: He exhibits no distension.  Musculoskeletal:       Right foot: There is decreased range  of motion, tenderness, bony tenderness and swelling. There is normal capillary refill and no deformity.       Feet:  As depicted, swollen and tender right first second and third distal toes. There is a superficial laceration/abrasion dorsum of great toe. Toenail intact. No subungual hematoma.  Neurological: He is alert and oriented to person, place, and time.  Skin: Skin is warm.  Psychiatric: He has a normal mood and affect.  Nursing note and vitals reviewed.   ED Course  Procedures (including critical care time) Labs Review Labs Reviewed - No data to display  Imaging Review Dg Foot Complete Right  05/22/2014   CLINICAL DATA:  Patient dropped weight on foot 1 hr ago  EXAM: RIGHT FOOT COMPLETE - 3+ VIEW  COMPARISON:  None.  FINDINGS: Frontal, oblique, and lateral views were obtained. There are displaced fractures of the distal aspects of the first, second, and third distal phalanx GE these with soft tissue injury in these areas. No other fractures are apparent. No dislocation. Joint spaces appear intact. There is a small inferior calcaneal spur.  IMPRESSION: Displaced fractures of the distal aspects of the spurs, second, and third distal phalanges with associated soft tissue injury. No dislocations. No appreciable arthropathic change. Small inferior calcaneal spur.   Electronically Signed   By: Lowella Grip M.D.   On: 05/22/2014 19:27     MDM   1. Displaced fracture of great toe, right, closed, initial encounter   2. Injuries   3. Closed displaced fracture of lesser toe of right foot, initial encounter   4. Abrasion of toe of right foot, initial encounter     Displaced fractures of the distal aspects of the spurs, second, and third distal phalanges with associated soft tissue injury.  No dislocations. No appreciable arthropathic change. Abrasion cleansed. A pressure dressing applied. Encourage rest, ice, compression with ACE bandage, and elevation of injured body part.  Update  tetanus shot. Postop shoe Crutches Discharge Medication List as of 05/22/2014  8:08 PM    START taking these medications   Details  cephALEXin (KEFLEX) 500 MG capsule Take 1 capsule (500 mg total) by mouth 2 (two) times daily., Starting 05/22/2014, Until Discontinued, Print    HYDROcodone-acetaminophen (NORCO/VICODIN) 5-325 MG per tablet Take 1-2 tablets by mouth every 4 (four) hours as needed for severe pain. Take with food., Starting 05/22/2014, Until Discontinued, Print    meloxicam (MOBIC) 7.5 MG tablet Take 1 twice a day as needed for pain. Take with food. (Do not take with any other NSAID.), Print       We made paper copies and disc copy of x-rays for patient and wife to bring to orthopedist. Follow-up with orthopedist tomorrow. Precautions discussed. Red flags discussed.  Questions invited and answered. Patient voiced understanding and agreement.   Jacqulyn Cane, MD 05/24/14 [phone]

## 2015-04-16 ENCOUNTER — Ambulatory Visit (INDEPENDENT_AMBULATORY_CARE_PROVIDER_SITE_OTHER): Payer: 59 | Admitting: Family

## 2015-04-16 ENCOUNTER — Encounter (INDEPENDENT_AMBULATORY_CARE_PROVIDER_SITE_OTHER): Payer: Self-pay

## 2015-04-16 ENCOUNTER — Encounter: Payer: Self-pay | Admitting: Family

## 2015-04-16 VITALS — HR 55 | Temp 98.6°F | Resp 16 | Wt 197.8 lb

## 2015-04-16 DIAGNOSIS — K4090 Unilateral inguinal hernia, without obstruction or gangrene, not specified as recurrent: Secondary | ICD-10-CM | POA: Diagnosis not present

## 2015-04-16 NOTE — Patient Instructions (Signed)
You will be contacted about your referral to surgeon for evaluation of possible right inguinal hernia.  Inguinal Hernia, Adult Muscles help keep everything in the body in its proper place. But if a weak spot in the muscles develops, something can poke through. That is called a hernia. When this happens in the lower part of the belly (abdomen), it is called an inguinal hernia. (It takes its name from a part of the body in this region called the inguinal canal.) A weak spot in the wall of muscles lets some fat or part of the small intestine bulge through. An inguinal hernia can develop at any age. Men get them more often than women. CAUSES  In adults, an inguinal hernia develops over time.  It can be triggered by:  Suddenly straining the muscles of the lower abdomen.  Lifting heavy objects.  Straining to have a bowel movement. Difficult bowel movements (constipation) can lead to this.  Constant coughing. This may be caused by smoking or lung disease.  Being overweight.  Being pregnant.  Working at a job that requires long periods of standing or heavy lifting.  Having had an inguinal hernia before. One type can be an emergency situation. It is called a strangulated inguinal hernia. It develops if part of the small intestine slips through the weak spot and cannot get back into the abdomen. The blood supply can be cut off. If that happens, part of the intestine may die. This situation requires emergency surgery. SYMPTOMS  Often, a small inguinal hernia has no symptoms. It is found when a healthcare provider does a physical exam. Larger hernias usually have symptoms.   In adults, symptoms may include:  A lump in the groin. This is easier to see when the person is standing. It might disappear when lying down.  In men, a lump in the scrotum.  Pain or burning in the groin. This occurs especially when lifting, straining or coughing.  A dull ache or feeling of pressure in the groin.  Signs  of a strangulated hernia can include:  A bulge in the groin that becomes very painful and tender to the touch.  A bulge that turns red or purple.  Fever, nausea and vomiting.  Inability to have a bowel movement or to pass gas. DIAGNOSIS  To decide if you have an inguinal hernia, a healthcare provider will probably do a physical examination.  This will include asking questions about any symptoms you have noticed.  The healthcare provider might feel the groin area and ask you to cough. If an inguinal hernia is felt, the healthcare provider may try to slide it back into the abdomen.  Usually no other tests are needed. TREATMENT  Treatments can vary. The size of the hernia makes a difference. Options include:  Watchful waiting. This is often suggested if the hernia is small and you have had no symptoms.  No medical procedure will be done unless symptoms develop.  You will need to watch closely for symptoms. If any occur, contact your healthcare provider right away.  Surgery. This is used if the hernia is larger or you have symptoms.  Open surgery. This is usually an outpatient procedure (you will not stay overnight in a hospital). An cut (incision) is made through the skin in the groin. The hernia is put back inside the abdomen. The weak area in the muscles is then repaired by herniorrhaphy or hernioplasty. Herniorrhaphy: in this type of surgery, the weak muscles are sewn back together. Hernioplasty:  a patch or mesh is used to close the weak area in the abdominal wall.  Laparoscopy. In this procedure, a surgeon makes small incisions. A thin tube with a tiny video camera (called a laparoscope) is put into the abdomen. The surgeon repairs the hernia with mesh by looking with the video camera and using two long instruments. HOME CARE INSTRUCTIONS   After surgery to repair an inguinal hernia:  You will need to take pain medicine prescribed by your healthcare provider. Follow all directions  carefully.  You will need to take care of the wound from the incision.  Your activity will be restricted for awhile. This will probably include no heavy lifting for several weeks. You also should not do anything too active for a few weeks. When you can return to work will depend on the type of job that you have.  During "watchful waiting" periods, you should:  Maintain a healthy weight.  Eat a diet high in fiber (fruits, vegetables and whole grains).  Drink plenty of fluids to avoid constipation. This means drinking enough water and other liquids to keep your urine clear or pale yellow.  Do not lift heavy objects.  Do not stand for long periods of time.  Quit smoking. This should keep you from developing a frequent cough. SEEK MEDICAL CARE IF:   A bulge develops in your groin area.  You feel pain, a burning sensation or pressure in the groin. This might be worse if you are lifting or straining.  You develop a fever of more than 100.5 F (38.1 C). SEEK IMMEDIATE MEDICAL CARE IF:   Pain in the groin increases suddenly.  A bulge in the groin gets bigger suddenly and does not go down.  For men, there is sudden pain in the scrotum. Or, the size of the scrotum increases.  A bulge in the groin area becomes red or purple and is painful to touch.  You have nausea or vomiting that does not go away.  You feel your heart beating much faster than normal.  You cannot have a bowel movement or pass gas.  You develop a fever of more than 102.0 F (38.9 C). Document Released: 11/21/2008 Document Revised: 09/27/2011 Document Reviewed: 11/21/2008 [facility] Patient Information 2015 [place], [place]. This information is not intended to replace advice given to you by your health care provider. Make sure you discuss any questions you have with your health care provider.

## 2015-04-16 NOTE — Progress Notes (Signed)
Pre visit review using our clinic review tool, if applicable. No additional management support is needed unless otherwise documented below in the visit note. 

## 2015-04-16 NOTE — Progress Notes (Signed)
Subjective:    Patient ID: Ryan Cabrera, male    DOB: 10/19/1975, 39 y.o.   MRN: [id number]  HPI  Ryan Cabrera is a 39 yr old male who presents today with his wife to discuss chief complaint of "hernia." Pt reports that he has had intermittent  R lower abdominal pain x 1 year. Pain radiates down into the right testicle.   Reports pain is worst with activities that require use of his abdominal muscles/straining- such as swimming or "dead lifting" weights.  Reports that he is moving his bowels without difficulty.  Denies associated nausea/vomitting.    Review of Systems See HPI  Past Medical History  Diagnosis Date  . Ankylosing spondylitis   . Depression   . Hematochezia     neg workup including EGD and colonoscopy  . Genital warts   . Chicken pox     childhood  . B12 deficiency     history of    Social History   Social History  . Marital Status: Married    Spouse Name: N/A  . Number of Children: N/A  . Years of Education: N/A   Occupational History  . 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 Smoking status: Passive Smoke Exposure - Never Smoker    Types: Cigars    Last Attempt to Quit: 07/19/1996  . Smokeless tobacco: Never Used     Comment: smoked for 9 yrs 1.5 packs per day. 1 cigar weekly.  . Alcohol Use: Yes     Comment: 2-7 drinks weekly  . Drug Use: Not on file  . Sexual Activity: Not on file   Other Topics Concern  . Not on file   Social History Narrative    No past surgical history on file.  Family History  Problem Relation Age of Onset  . Ankylosing spondylitis Father   . Alcohol abuse Father   . Hypertension Father   . Diabetes Paternal Grandmother   . Cancer Neg Hx     Allergies  Allergen Reactions  . Molds & Smuts Other (See Comments)    dizziness  . Sulfasalazine Other (See Comments)    Abdominal pain. Nausea  . Sulfonamide Derivatives Other (See Comments)    Abdominal pain    Current Outpatient  Prescriptions on File Prior to Visit  Medication Sig Dispense Refill  . cephALEXin (KEFLEX) 500 MG capsule Take 1 capsule (500 mg total) by mouth 2 (two) times daily. 14 capsule 0  . clonazePAM (KLONOPIN) 0.5 MG tablet Take 0.5 mg by mouth as needed.      . ETODOLAC PO Take by mouth. As needed for ankylosing spondilitis.     . fish oil-omega-3 fatty acids 1000 MG capsule Take 2 tablets daily.    Marland Kitchen glucosamine-chondroitin 500-400 MG tablet Take 2 tablets daily.     Marland Kitchen HYDROcodone-acetaminophen (NORCO/VICODIN) 5-325 MG per tablet Take 1-2 tablets by mouth every 4 (four) hours as needed for severe pain. Take with food. 12 tablet 0  . ibuprofen (ADVIL,MOTRIN) 200 MG tablet Take 200 mg by mouth every 6 (six) hours as needed.      . meloxicam (MOBIC) 7.5 MG tablet Take 1 twice a day as needed for pain. Take with food. (Do not take with any other NSAID.) 20 tablet 0  . Multiple Vitamin (MULTIVITAMIN) tablet Take 1 tablet by mouth daily.      Marland Kitchen neomycin-polymyxin-hydrocortisone (CORTISPORIN) otic solution Place 3 drops into the left ear  4 (four) times daily.      Marland Kitchen omeprazole (PRILOSEC) 20 MG capsule Take 20 mg by mouth daily.      . vitamin B-12 (CYANOCOBALAMIN) 1000 MCG tablet Take 1,000 mcg by mouth daily.      . vitamin C (ASCORBIC ACID) 500 MG tablet Take by mouth daily. Take 4-6 tab po daily      No current facility-administered medications on file prior to visit.    Pulse 55  Temp(Src) 98.6 F (37 C) (Oral)  Resp 16  Wt 197 lb 12.8 oz (89.721 kg)  SpO2 100%       Objective:   Physical Exam  Constitutional: He is oriented to person, place, and time. He appears well-developed and well-nourished. No distress.  HENT:  Head: Normocephalic and atraumatic.  Cardiovascular: Normal rate and regular rhythm.   No murmur heard. Pulmonary/Chest: Effort normal and breath sounds normal. No respiratory distress. He has no wheezes. He has no rales.  Abdominal: Soft. He exhibits no distension. There  is no tenderness.  + hypoactive BS  Genitourinary:  Slight bulge noted in inguinal canal with cough  Musculoskeletal: He exhibits no edema.  Neurological: He is alert and oriented to person, place, and time.  Skin: Skin is warm and dry.  Psychiatric: He has a normal mood and affect. His behavior is normal. Thought content normal.          Assessment & Plan:  Pt declines flu shot.   Probable Right Inguinal Hernia on exam. New. Will refer to surgeon for further evaluation. Advised pt to avoid strenuous lifting, painful activities.  We discussed signs/symptoms of incarcerated hernia and pt is advised to go to the ER if these symptoms occur.

## 2015-08-29 DIAGNOSIS — Z720 Tobacco use: Secondary | ICD-10-CM | POA: Diagnosis not present

## 2015-08-29 DIAGNOSIS — Z79899 Other long term (current) drug therapy: Secondary | ICD-10-CM | POA: Diagnosis not present

## 2015-08-29 DIAGNOSIS — Z791 Long term (current) use of non-steroidal anti-inflammatories (NSAID): Secondary | ICD-10-CM | POA: Diagnosis not present

## 2015-08-29 DIAGNOSIS — M459 Ankylosing spondylitis of unspecified sites in spine: Secondary | ICD-10-CM | POA: Diagnosis not present

## 2015-11-04 DIAGNOSIS — R946 Abnormal results of thyroid function studies: Secondary | ICD-10-CM | POA: Diagnosis not present

## 2015-11-04 DIAGNOSIS — E559 Vitamin D deficiency, unspecified: Secondary | ICD-10-CM | POA: Diagnosis not present

## 2015-11-04 DIAGNOSIS — R109 Unspecified abdominal pain: Secondary | ICD-10-CM | POA: Diagnosis not present

## 2015-11-11 DIAGNOSIS — M459 Ankylosing spondylitis of unspecified sites in spine: Secondary | ICD-10-CM | POA: Diagnosis not present

## 2015-11-11 DIAGNOSIS — M791 Myalgia: Secondary | ICD-10-CM | POA: Diagnosis not present

## 2015-11-11 DIAGNOSIS — R700 Elevated erythrocyte sedimentation rate: Secondary | ICD-10-CM | POA: Diagnosis not present

## 2015-11-11 DIAGNOSIS — E559 Vitamin D deficiency, unspecified: Secondary | ICD-10-CM | POA: Diagnosis not present

## 2015-12-09 DIAGNOSIS — M459 Ankylosing spondylitis of unspecified sites in spine: Secondary | ICD-10-CM | POA: Diagnosis not present

## 2015-12-09 DIAGNOSIS — H524 Presbyopia: Secondary | ICD-10-CM | POA: Diagnosis not present

## 2015-12-09 DIAGNOSIS — H02055 Trichiasis without entropian left lower eyelid: Secondary | ICD-10-CM | POA: Diagnosis not present

## 2016-01-23 DIAGNOSIS — M545 Low back pain: Secondary | ICD-10-CM | POA: Diagnosis not present

## 2016-01-23 DIAGNOSIS — M459 Ankylosing spondylitis of unspecified sites in spine: Secondary | ICD-10-CM | POA: Diagnosis not present

## 2016-01-23 DIAGNOSIS — R102 Pelvic and perineal pain: Secondary | ICD-10-CM | POA: Diagnosis not present

## 2016-01-23 DIAGNOSIS — Z791 Long term (current) use of non-steroidal anti-inflammatories (NSAID): Secondary | ICD-10-CM | POA: Diagnosis not present

## 2016-02-12 DIAGNOSIS — L089 Local infection of the skin and subcutaneous tissue, unspecified: Secondary | ICD-10-CM | POA: Diagnosis not present

## 2016-03-16 DIAGNOSIS — D2261 Melanocytic nevi of right upper limb, including shoulder: Secondary | ICD-10-CM | POA: Diagnosis not present

## 2016-03-16 DIAGNOSIS — D2262 Melanocytic nevi of left upper limb, including shoulder: Secondary | ICD-10-CM | POA: Diagnosis not present

## 2016-03-16 DIAGNOSIS — D2372 Other benign neoplasm of skin of left lower limb, including hip: Secondary | ICD-10-CM | POA: Diagnosis not present

## 2016-03-16 DIAGNOSIS — L821 Other seborrheic keratosis: Secondary | ICD-10-CM | POA: Diagnosis not present

## 2016-03-16 DIAGNOSIS — D225 Melanocytic nevi of trunk: Secondary | ICD-10-CM | POA: Diagnosis not present

## 2016-03-16 DIAGNOSIS — D2362 Other benign neoplasm of skin of left upper limb, including shoulder: Secondary | ICD-10-CM | POA: Diagnosis not present

## 2016-03-16 MED FILL — TRIAMCINOLONE 0.1% CREAM: 0.1 | 14 days supply | Qty: 45 | Fill #0

## 2016-09-09 MED FILL — TRIAMCINOLONE 0.1% CREAM: 0.1 | 14 days supply | Qty: 45 | Fill #1

## 2016-11-02 DIAGNOSIS — M791 Myalgia: Secondary | ICD-10-CM | POA: Diagnosis not present

## 2016-11-02 DIAGNOSIS — M459 Ankylosing spondylitis of unspecified sites in spine: Secondary | ICD-10-CM | POA: Diagnosis not present

## 2016-11-02 DIAGNOSIS — K2970 Gastritis, unspecified, without bleeding: Secondary | ICD-10-CM | POA: Diagnosis not present

## 2016-11-02 DIAGNOSIS — R700 Elevated erythrocyte sedimentation rate: Secondary | ICD-10-CM | POA: Diagnosis not present

## 2016-11-02 DIAGNOSIS — E559 Vitamin D deficiency, unspecified: Secondary | ICD-10-CM | POA: Diagnosis not present

## 2016-11-10 DIAGNOSIS — Z0189 Encounter for other specified special examinations: Secondary | ICD-10-CM | POA: Diagnosis not present

## 2016-12-09 DIAGNOSIS — Z973 Presence of spectacles and contact lenses: Secondary | ICD-10-CM | POA: Diagnosis not present

## 2016-12-09 DIAGNOSIS — H527 Unspecified disorder of refraction: Secondary | ICD-10-CM | POA: Diagnosis not present

## 2016-12-09 DIAGNOSIS — M459 Ankylosing spondylitis of unspecified sites in spine: Secondary | ICD-10-CM | POA: Diagnosis not present

## 2017-01-20 DIAGNOSIS — F17290 Nicotine dependence, other tobacco product, uncomplicated: Secondary | ICD-10-CM | POA: Diagnosis not present

## 2017-01-20 DIAGNOSIS — M459 Ankylosing spondylitis of unspecified sites in spine: Secondary | ICD-10-CM | POA: Diagnosis not present

## 2017-05-05 DIAGNOSIS — G4733 Obstructive sleep apnea (adult) (pediatric): Secondary | ICD-10-CM | POA: Diagnosis not present

## 2017-05-16 DIAGNOSIS — B078 Other viral warts: Secondary | ICD-10-CM | POA: Diagnosis not present

## 2017-05-16 DIAGNOSIS — D2371 Other benign neoplasm of skin of right lower limb, including hip: Secondary | ICD-10-CM | POA: Diagnosis not present

## 2017-05-16 DIAGNOSIS — L603 Nail dystrophy: Secondary | ICD-10-CM | POA: Diagnosis not present

## 2017-05-16 DIAGNOSIS — D2221 Melanocytic nevi of right ear and external auricular canal: Secondary | ICD-10-CM | POA: Diagnosis not present

## 2017-05-16 DIAGNOSIS — D225 Melanocytic nevi of trunk: Secondary | ICD-10-CM | POA: Diagnosis not present

## 2017-05-16 DIAGNOSIS — D2362 Other benign neoplasm of skin of left upper limb, including shoulder: Secondary | ICD-10-CM | POA: Diagnosis not present

## 2017-05-16 DIAGNOSIS — D2372 Other benign neoplasm of skin of left lower limb, including hip: Secondary | ICD-10-CM | POA: Diagnosis not present

## 2017-05-16 DIAGNOSIS — L820 Inflamed seborrheic keratosis: Secondary | ICD-10-CM | POA: Diagnosis not present

## 2017-06-14 DIAGNOSIS — G4733 Obstructive sleep apnea (adult) (pediatric): Secondary | ICD-10-CM | POA: Diagnosis not present

## 2017-06-16 DIAGNOSIS — M450 Ankylosing spondylitis of multiple sites in spine: Secondary | ICD-10-CM | POA: Diagnosis not present

## 2017-06-16 DIAGNOSIS — Z791 Long term (current) use of non-steroidal anti-inflammatories (NSAID): Secondary | ICD-10-CM | POA: Diagnosis not present

## 2017-07-18 DIAGNOSIS — Z9989 Dependence on other enabling machines and devices: Secondary | ICD-10-CM | POA: Diagnosis not present

## 2017-07-18 DIAGNOSIS — G4733 Obstructive sleep apnea (adult) (pediatric): Secondary | ICD-10-CM | POA: Diagnosis not present

## 2017-07-29 DIAGNOSIS — G4733 Obstructive sleep apnea (adult) (pediatric): Secondary | ICD-10-CM | POA: Diagnosis not present

## 2017-09-09 DIAGNOSIS — G4733 Obstructive sleep apnea (adult) (pediatric): Secondary | ICD-10-CM | POA: Diagnosis not present

## 2017-11-03 DIAGNOSIS — E559 Vitamin D deficiency, unspecified: Secondary | ICD-10-CM | POA: Diagnosis not present

## 2017-11-10 DIAGNOSIS — L603 Nail dystrophy: Secondary | ICD-10-CM | POA: Diagnosis not present

## 2017-11-10 DIAGNOSIS — G4733 Obstructive sleep apnea (adult) (pediatric): Secondary | ICD-10-CM | POA: Diagnosis not present

## 2017-11-10 DIAGNOSIS — E559 Vitamin D deficiency, unspecified: Secondary | ICD-10-CM | POA: Diagnosis not present

## 2017-11-10 DIAGNOSIS — J3489 Other specified disorders of nose and nasal sinuses: Secondary | ICD-10-CM | POA: Diagnosis not present

## 2017-11-16 ENCOUNTER — Emergency Department
Admission: EM | Admit: 2017-11-16 | Discharge: 2017-11-16 | Disposition: A | Discharge status: Home / Self Care | Payer: 59 | Source: Home / Self Care

## 2017-11-16 ENCOUNTER — Encounter: Payer: Self-pay | Admitting: *Deleted

## 2017-11-16 ENCOUNTER — Other Ambulatory Visit: Payer: Self-pay

## 2017-11-16 ENCOUNTER — Emergency Department (INDEPENDENT_AMBULATORY_CARE_PROVIDER_SITE_OTHER): Payer: 59

## 2017-11-16 DIAGNOSIS — S20211A Contusion of right front wall of thorax, initial encounter: Secondary | ICD-10-CM | POA: Diagnosis not present

## 2017-11-16 DIAGNOSIS — S299XXA Unspecified injury of thorax, initial encounter: Secondary | ICD-10-CM | POA: Diagnosis not present

## 2017-11-16 DIAGNOSIS — R0781 Pleurodynia: Secondary | ICD-10-CM | POA: Diagnosis not present

## 2017-11-16 MED ORDER — IBUPROFEN 800 MG PO TABS: 800.0000 mg | ORAL_TABLET | Freq: Three times a day (TID) | ORAL | 0 refills | Status: AC

## 2017-11-16 MED FILL — IBUPROFEN 800 MG TAB: 800 | 7 days supply | Qty: 21 | Fill #0

## 2017-11-16 NOTE — ED Triage Notes (Signed)
Pt c/o RT rib area pain x 2 days after tripping over 1st base while playing kickball. He is taking Motrin 800mg  q 8 hrs prn.

## 2017-11-16 NOTE — ED Provider Notes (Signed)
[place]   [id number] 11/16/17 Arrival Time: 1504   SUBJECTIVE:  Ryan Cabrera is a 42 y.o. male who presents to the urgent care with complaint of right chest wall pain after falling with right arm underneath right side.  He was playing kick ball and tripped over first base.    Patient has had right chest pain laterally and upper anterior area since, worse with deep breath.  No shortness of breath or ecchymosis.  Patient is a Therapist, nutritional, plays drums. He bruised his right hand when he fell, but can move the hand normally without pain.     Past Medical History:  Diagnosis Date  . Ankylosing spondylitis ([place])   . B12 deficiency    history of  . Chicken pox    childhood  . Depression   . Genital warts   . Hematochezia    neg workup including EGD and colonoscopy   Family History  Problem Relation Age of Onset  . Ankylosing spondylitis Father   . Alcohol abuse Father   . Hypertension Father   . Diabetes Paternal Grandmother   . Cancer Neg Hx    Social History   Socioeconomic History  . Marital status: Married    Spouse name: Not on file  . Number of children: Not on file  . Years of education: Not on file  . Highest education level: Not on file  Occupational History  . Occupation: retired    Fish farm manager: Self Employed    Comment: medically from [place]  . Financial resource strain: Not on file  . Food insecurity:    Worry: Not on file    Inability: Not on file  . Transportation needs:    Medical: Not on file    Non-medical: Not on file  Tobacco Use  . Smoking status: Passive Smoke Exposure - Never Smoker  . Smokeless tobacco: Never Used  . Tobacco comment: smoked for 9 yrs 1.5 packs per day. 1 cigar weekly.  Substance and Sexual Activity  . Alcohol use: Yes    Comment: 2-7 drinks weekly  . Drug use: Not on file  . Sexual activity: Not on file  Lifestyle  . Physical activity:    Days per week: Not on file    Minutes per session: Not  on file  . Stress: Not on file  Relationships  . Social connections:    Talks on phone: Not on file    Gets together: Not on file    Attends religious service: Not on file    Active member of club or organization: Not on file    Attends meetings of clubs or organizations: Not on file    Relationship status: Not on file  . Intimate partner violence:    Fear of current or ex partner: Not on file    Emotionally abused: Not on file    Physically abused: Not on file    Forced sexual activity: Not on file  Other Topics Concern  . Not on file  Social History Narrative  . Not on file   No outpatient medications have been marked as taking for the 11/16/17 encounter [facility] Encounter).   Allergies  Allergen Reactions  . Molds & Smuts Other (See Comments)    dizziness  . Sulfasalazine Other (See Comments)    Abdominal pain. Nausea  . Sulfonamide Derivatives Other (See Comments)    Abdominal pain      ROS: As per HPI, remainder of ROS negative.  OBJECTIVE:   Vitals:   11/16/17 1526 11/16/17 1529  BP: (!) 155/89   Pulse: (!) 54   Resp: 16   Temp: 98.1 F (36.7 C)   TempSrc: Oral   SpO2: 99%   Weight:  205 lb (93 kg)     General appearance: alert; no distress Eyes: PERRL; EOMI; conjunctiva normal HENT: normocephalic; atraumatic;oral mucosa normal Neck: supple Lungs: clear to auscultation bilaterally; tender upper right chest anteriorly and lateral right chest. Heart: regular rate and rhythm Back: no CVA tenderness Extremities: no cyanosis or edema; symmetrical with no gross deformities; ecchymosis right dorsal hand Skin: warm and dry Neurologic: normal gait; grossly normal Psychological: alert and cooperative; normal mood and affect      Labs:  Results for orders placed or performed during the hospital encounter of 01/08/14  Rocky mtn spotted fvr ab, IgG-blood  Result Value Ref Range   RMSF IgG 0.71 IV  Rocky mtn spotted fvr ab, IgM-blood  Result Value  Ref Range   ROCKY MTN SPOTTED FEVER, IGM 0.23 IV  B. burgdorfi antibodies by WB  Result Value Ref Range   B burgdorferi IgG Abs (IB) Negative    B burgdorferi IgM Abs (IB) Negative   CBC with Differential  Result Value Ref Range   WBC 7.8 4.0 - 10.5 K/uL   RBC 4.87 4.22 - 5.81 MIL/uL   Hemoglobin 14.8 13.0 - 17.0 g/dL   HCT 43.2 39.0 - 52.0 %   MCV 88.7 78.0 - 100.0 fL   MCH 30.4 26.0 - 34.0 pg   MCHC 34.3 30.0 - 36.0 g/dL   RDW 13.6 11.5 - 15.5 %   Platelets 275 150 - 400 K/uL   Neutrophils Relative % 77 43 - 77 %   Neutro Abs 6.0 1.7 - 7.7 K/uL   Lymphocytes Relative 17 12 - 46 %   Lymphs Abs 1.3 0.7 - 4.0 K/uL   Monocytes Relative 5 3 - 12 %   Monocytes Absolute 0.4 0.1 - 1.0 K/uL   Eosinophils Relative 1 0 - 5 %   Eosinophils Absolute 0.1 0.0 - 0.7 K/uL   Basophils Relative 0 0 - 1 %   Basophils Absolute 0.0 0.0 - 0.1 K/uL   Smear Review Criteria for review not met     Labs Reviewed - No data to display  No results found.     ASSESSMENT & PLAN:  1. Contusion of right chest wall, initial encounter     Meds ordered this encounter  Medications  . ibuprofen (ADVIL,MOTRIN) 800 MG tablet    Sig: Take 1 tablet (800 mg total) by mouth 3 (three) times daily.    Dispense:  21 tablet    Refill:  0    Reviewed expectations re: course of current medical issues. Questions answered. Outlined signs and symptoms indicating need for more acute intervention. Patient verbalized understanding. After Visit Summary given.    Procedures:      Robyn Haber, MD 11/16/17 [phone]

## 2017-12-15 DIAGNOSIS — Z973 Presence of spectacles and contact lenses: Secondary | ICD-10-CM | POA: Diagnosis not present

## 2017-12-15 DIAGNOSIS — M459 Ankylosing spondylitis of unspecified sites in spine: Secondary | ICD-10-CM | POA: Diagnosis not present

## 2017-12-15 DIAGNOSIS — H527 Unspecified disorder of refraction: Secondary | ICD-10-CM | POA: Diagnosis not present

## 2018-01-30 DIAGNOSIS — M216X9 Other acquired deformities of unspecified foot: Secondary | ICD-10-CM | POA: Diagnosis not present

## 2018-01-30 DIAGNOSIS — L603 Nail dystrophy: Secondary | ICD-10-CM | POA: Diagnosis not present

## 2018-03-09 DIAGNOSIS — M459 Ankylosing spondylitis of unspecified sites in spine: Secondary | ICD-10-CM | POA: Diagnosis not present

## 2018-03-09 DIAGNOSIS — Z791 Long term (current) use of non-steroidal anti-inflammatories (NSAID): Secondary | ICD-10-CM | POA: Diagnosis not present

## 2018-06-14 DIAGNOSIS — L720 Epidermal cyst: Secondary | ICD-10-CM | POA: Diagnosis not present

## 2018-06-14 DIAGNOSIS — D2222 Melanocytic nevi of left ear and external auricular canal: Secondary | ICD-10-CM | POA: Diagnosis not present

## 2018-06-14 DIAGNOSIS — D235 Other benign neoplasm of skin of trunk: Secondary | ICD-10-CM | POA: Diagnosis not present

## 2018-06-14 DIAGNOSIS — D2371 Other benign neoplasm of skin of right lower limb, including hip: Secondary | ICD-10-CM | POA: Diagnosis not present

## 2018-06-14 DIAGNOSIS — D224 Melanocytic nevi of scalp and neck: Secondary | ICD-10-CM | POA: Diagnosis not present

## 2018-06-14 DIAGNOSIS — D225 Melanocytic nevi of trunk: Secondary | ICD-10-CM | POA: Diagnosis not present

## 2018-06-14 DIAGNOSIS — D2372 Other benign neoplasm of skin of left lower limb, including hip: Secondary | ICD-10-CM | POA: Diagnosis not present

## 2018-06-14 DIAGNOSIS — D2271 Melanocytic nevi of right lower limb, including hip: Secondary | ICD-10-CM | POA: Diagnosis not present

## 2018-06-14 DIAGNOSIS — D2262 Melanocytic nevi of left upper limb, including shoulder: Secondary | ICD-10-CM | POA: Diagnosis not present

## 2018-06-14 MED FILL — TRIAMCINOLONE 0.1% CREAM: 0.1 | 14 days supply | Qty: 45 | Fill #0

## 2018-06-20 DIAGNOSIS — F419 Anxiety disorder, unspecified: Secondary | ICD-10-CM | POA: Diagnosis not present

## 2018-07-03 DIAGNOSIS — G4733 Obstructive sleep apnea (adult) (pediatric): Secondary | ICD-10-CM | POA: Diagnosis not present

## 2018-07-04 DIAGNOSIS — F419 Anxiety disorder, unspecified: Secondary | ICD-10-CM | POA: Diagnosis not present

## 2018-07-18 DIAGNOSIS — F419 Anxiety disorder, unspecified: Secondary | ICD-10-CM | POA: Diagnosis not present

## 2018-07-24 DIAGNOSIS — F419 Anxiety disorder, unspecified: Secondary | ICD-10-CM | POA: Diagnosis not present

## 2018-08-01 DIAGNOSIS — F419 Anxiety disorder, unspecified: Secondary | ICD-10-CM | POA: Diagnosis not present

## 2018-08-08 DIAGNOSIS — F419 Anxiety disorder, unspecified: Secondary | ICD-10-CM | POA: Diagnosis not present

## 2018-08-21 DIAGNOSIS — F419 Anxiety disorder, unspecified: Secondary | ICD-10-CM | POA: Diagnosis not present

## 2018-09-20 MED FILL — BENZONATATE 100 MG CAP: 100 | 7 days supply | Qty: 21 | Fill #0

## 2019-01-16 DIAGNOSIS — M542 Cervicalgia: Secondary | ICD-10-CM | POA: Diagnosis not present

## 2019-01-18 DIAGNOSIS — M452 Ankylosing spondylitis of cervical region: Secondary | ICD-10-CM | POA: Diagnosis not present

## 2019-02-09 DIAGNOSIS — G4733 Obstructive sleep apnea (adult) (pediatric): Secondary | ICD-10-CM | POA: Diagnosis not present

## 2019-02-28 DIAGNOSIS — H00016 Hordeolum externum left eye, unspecified eyelid: Secondary | ICD-10-CM | POA: Diagnosis not present

## 2019-02-28 MED FILL — CEPHALEXIN 500 MG CAPSULE: 500 | 10 days supply | Qty: 20 | Fill #0

## 2019-04-02 DIAGNOSIS — E559 Vitamin D deficiency, unspecified: Secondary | ICD-10-CM | POA: Diagnosis not present

## 2019-04-02 DIAGNOSIS — E663 Overweight: Secondary | ICD-10-CM | POA: Diagnosis not present

## 2019-04-02 DIAGNOSIS — Z131 Encounter for screening for diabetes mellitus: Secondary | ICD-10-CM | POA: Diagnosis not present

## 2019-04-05 DIAGNOSIS — M459 Ankylosing spondylitis of unspecified sites in spine: Secondary | ICD-10-CM | POA: Diagnosis not present

## 2019-06-22 ENCOUNTER — Telehealth: Payer: 59 | Admitting: Physician Assistant

## 2019-06-22 ENCOUNTER — Inpatient Hospital Stay: Admission: RE | Admit: 2019-06-22 | Payer: 59 | Source: Ambulatory Visit

## 2019-06-22 ENCOUNTER — Ambulatory Visit (INDEPENDENT_AMBULATORY_CARE_PROVIDER_SITE_OTHER)
Admission: RE | Admit: 2019-06-22 | Discharge: 2019-06-22 | Disposition: A | Discharge status: Home / Self Care | Payer: 59 | Source: Ambulatory Visit

## 2019-06-22 DIAGNOSIS — R0981 Nasal congestion: Secondary | ICD-10-CM

## 2019-06-22 DIAGNOSIS — R0602 Shortness of breath: Secondary | ICD-10-CM

## 2019-06-22 NOTE — Progress Notes (Signed)
Based on what you shared with me, I feel your condition warrants further evaluation and I recommend that you be seen for a face to face office visit. Giving you are noting like you cannot get enough breath moreso than nasal congestion, this needs to be looked at. I would recommend a video visit with your PCP or to be seen at closest Urgent Care for evaluation.   NOTE: If you entered your credit card information for this eVisit, you will not be charged. You may see a "hold" on your card for the $35 but that hold will drop off and you will not have a charge processed.   If you are having a true medical emergency please call 911.      For an urgent face to face visit, Ryan Cabrera has five urgent care centers for your convenience:      NEW:  [facility] [place] [facility] [place] Get Driving Directions [id number] [address], [place] [address] . 10 am - 6pm Monday - Friday    [facility]) Get Driving Directions [id number] [address] [place], [place] [address] . 10 am to 8 pm Monday-Friday . 12 pm to 8 pm [facility] at [facility] Get Driving Directions [id number] [place], [place], [place] [address] . 8 am to 8 pm Monday-Friday . 9 am to 6 pm Saturday . 11 am to 6 pm Sunday     [facility] at [facility] Get Driving Directions  [id number] [address] [place], [place] [address] . 8 am to 8 pm Monday-Friday . 8 am to 4 pm [facility] at [place] Get Driving Directions [id number] [place] [place],  [address] . 12 pm to 6 pm Monday-Friday      Your e-visit answers were reviewed by a board certified advanced clinical practitioner to complete your personal care plan.  Thank you for using e-Visits.

## 2019-06-22 NOTE — Progress Notes (Signed)
Duplicate encounter. Please disregard.

## 2019-06-22 NOTE — Discharge Instructions (Signed)
Try the Nasacort to see if that helps.  Keep using the netty pot  If symptoms worsen follow up with ENT.  Stop using the afrin

## 2019-06-22 NOTE — ED Provider Notes (Signed)
Virtual Visit via Video Note:  Ryan Cabrera  initiated request for Telemedicine visit with [facility] team. I connected with Ryan Cabrera  on 06/22/2019 at 5:42 PM  for a synchronized telemedicine visit using a video enabled HIPPA compliant telemedicine application. I verified that I am speaking with Ryan Cabrera  using two identifiers. Ryan July, NP  was physically located in a [facility] Urgent care site and Ryan Cabrera was located at a different location.   The limitations of evaluation and management by telemedicine as well as the availability of in-person appointments were discussed. Patient was informed that he  may incur a bill ( including co-pay) for this virtual visit encounter. Ryan Cabrera  expressed understanding and gave verbal consent to proceed with virtual visit.     History of Present Illness:Ryan Cabrera  is a 43 y.o. male presents with sinus congestion, burning in nasal passages.  Symptoms have been constant, waxing waning.  Reporting recently started swimming indoors 5 days a week.  Has been using Afrin with some relief.  Reporting that after he used a Nettie pot and cleaned out his nasal passages his nose felt much better.  Concerned this may be associated with the chlorine in the pool.  Also concerned for possible Covid.  Denies any associated rhinorrhea, sore throat, ear pain, cough, congestion, fever, loss of taste or smell or diarrhea.  No recent sick contacts or recent traveling.   Past Medical History:  Diagnosis Date  . Ankylosing spondylitis ([place])   . B12 deficiency    history of  . Chicken pox    childhood  . Depression   . Genital warts   . Hematochezia    neg workup including EGD and colonoscopy    Allergies  Allergen Reactions  . Molds & Smuts Other (See Comments)    dizziness  . Sulfasalazine Other (See Comments)    Abdominal pain. Nausea  . Sulfonamide Derivatives Other (See Comments)    Abdominal pain         Observations/Objective:VITALS: Per patient if applicable, see vitals. GENERAL: Alert, appears well and in no acute distress. HEENT: Atraumatic, conjunctiva clear, no obvious abnormalities on inspection of external nose and ears. NECK: Normal movements of the head and neck. CARDIOPULMONARY: No increased WOB. Speaking in clear sentences. I:E ratio WNL.  MS: Moves all visible extremities without noticeable abnormality. PSYCH: Pleasant and cooperative, well-groomed. Speech normal rate and rhythm. Affect is appropriate. Insight and judgement are appropriate. Attention is focused, linear, and appropriate.  NEURO: CN grossly intact. Oriented as arrived to appointment on time with no prompting. Moves both UE equally.  SKIN: No obvious lesions, wounds, erythema, or cyanosis noted on face or hands.     Assessment and Plan: Most likely the patient's problem is coming from swimming in chlorine in the pool. We will have him try using a nose clip and continue to clean out his nasal passages with a Nettie pot after each swim. He can use Flonase nasal spray for nasal congestion. Also recommend to discontinue the Afrin due to rebound   Follow Up Instructions:Follow up as needed for continued or worsening symptoms     I discussed the assessment and treatment plan with the patient. The patient was provided an opportunity to ask questions and all were answered. The patient agreed with the plan and demonstrated an understanding of the instructions.   The patient was advised to call back or seek an in-person evaluation if the symptoms worsen or  if the condition fails to improve as anticipated.      Ryan July, NP  06/22/2019 5:42 PM         Ryan July, NP 06/25/19 1012

## 2019-07-02 DIAGNOSIS — G4733 Obstructive sleep apnea (adult) (pediatric): Secondary | ICD-10-CM | POA: Diagnosis not present

## 2019-07-21 IMAGING — DX DG RIBS W/ CHEST 3+V*R*
3 series · 3 of 3 positions shown · non-contrast
Comparison: Chest radiograph December 26, 2007

CLINICAL DATA: Fell 2 days ago.  RIGHT rib pain.

EXAM:
RIGHT RIBS AND CHEST - 3+ VIEW

[chest pa]
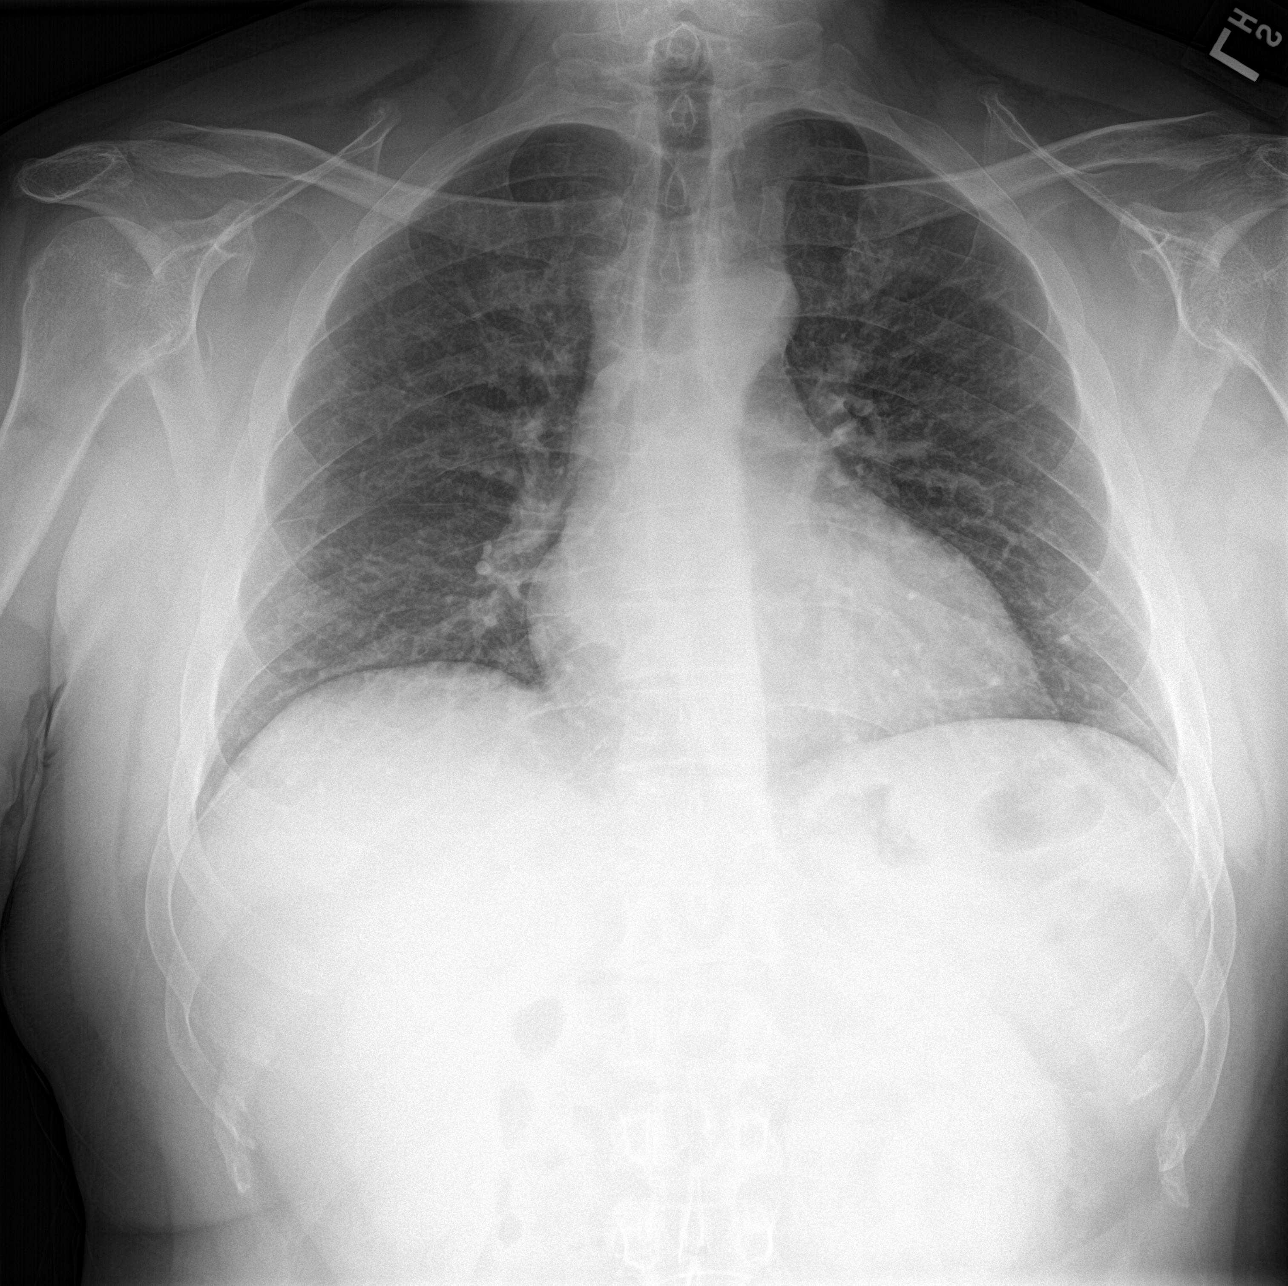

[rib pa]
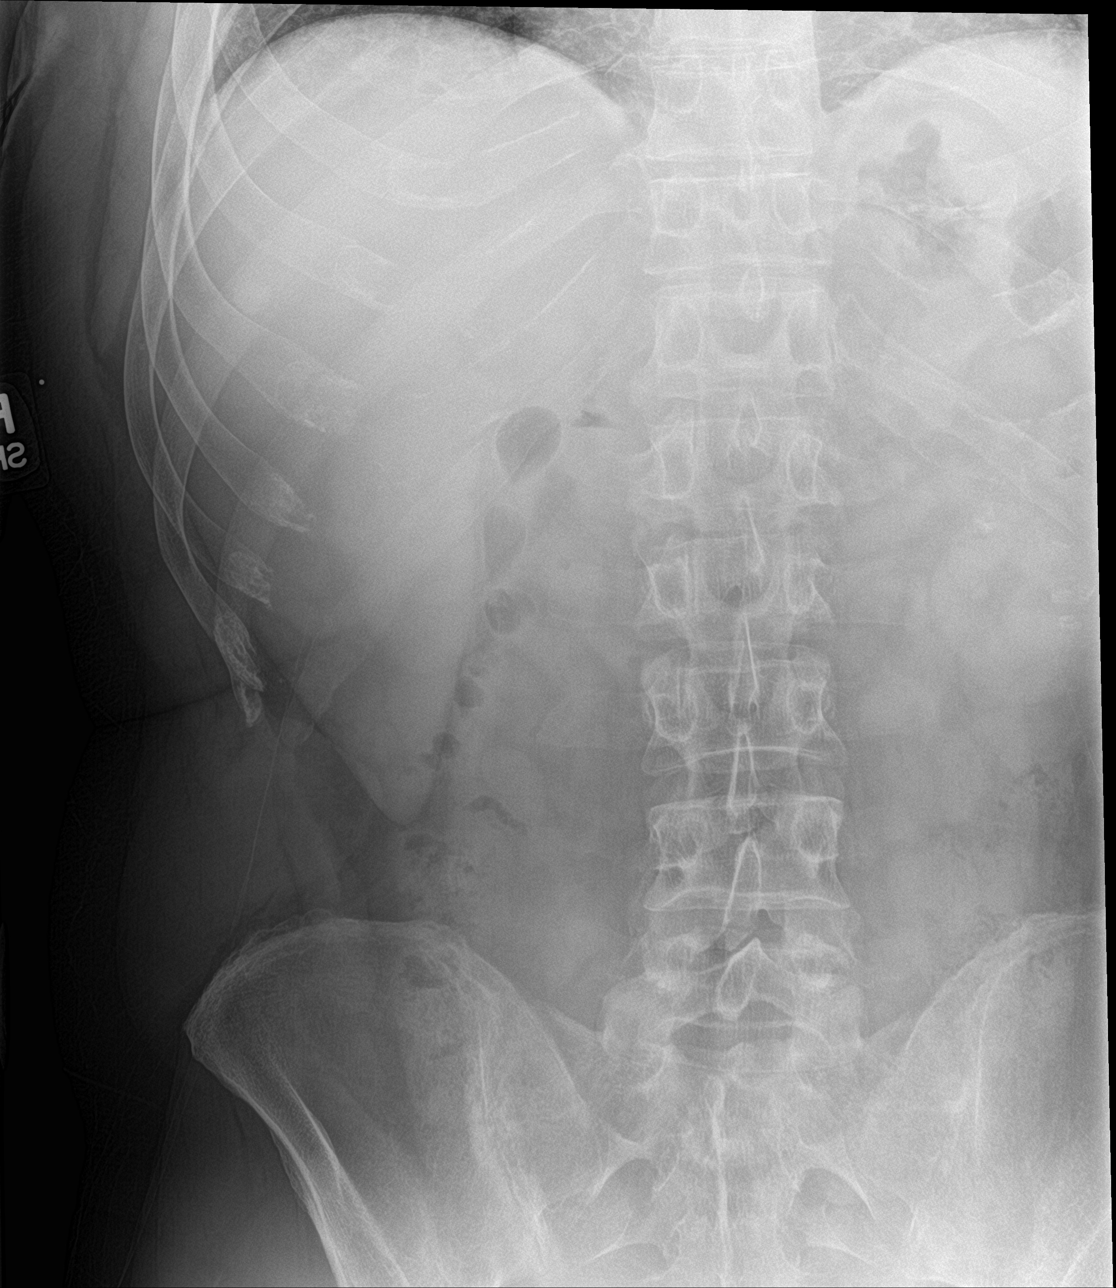

[rib pa obl]
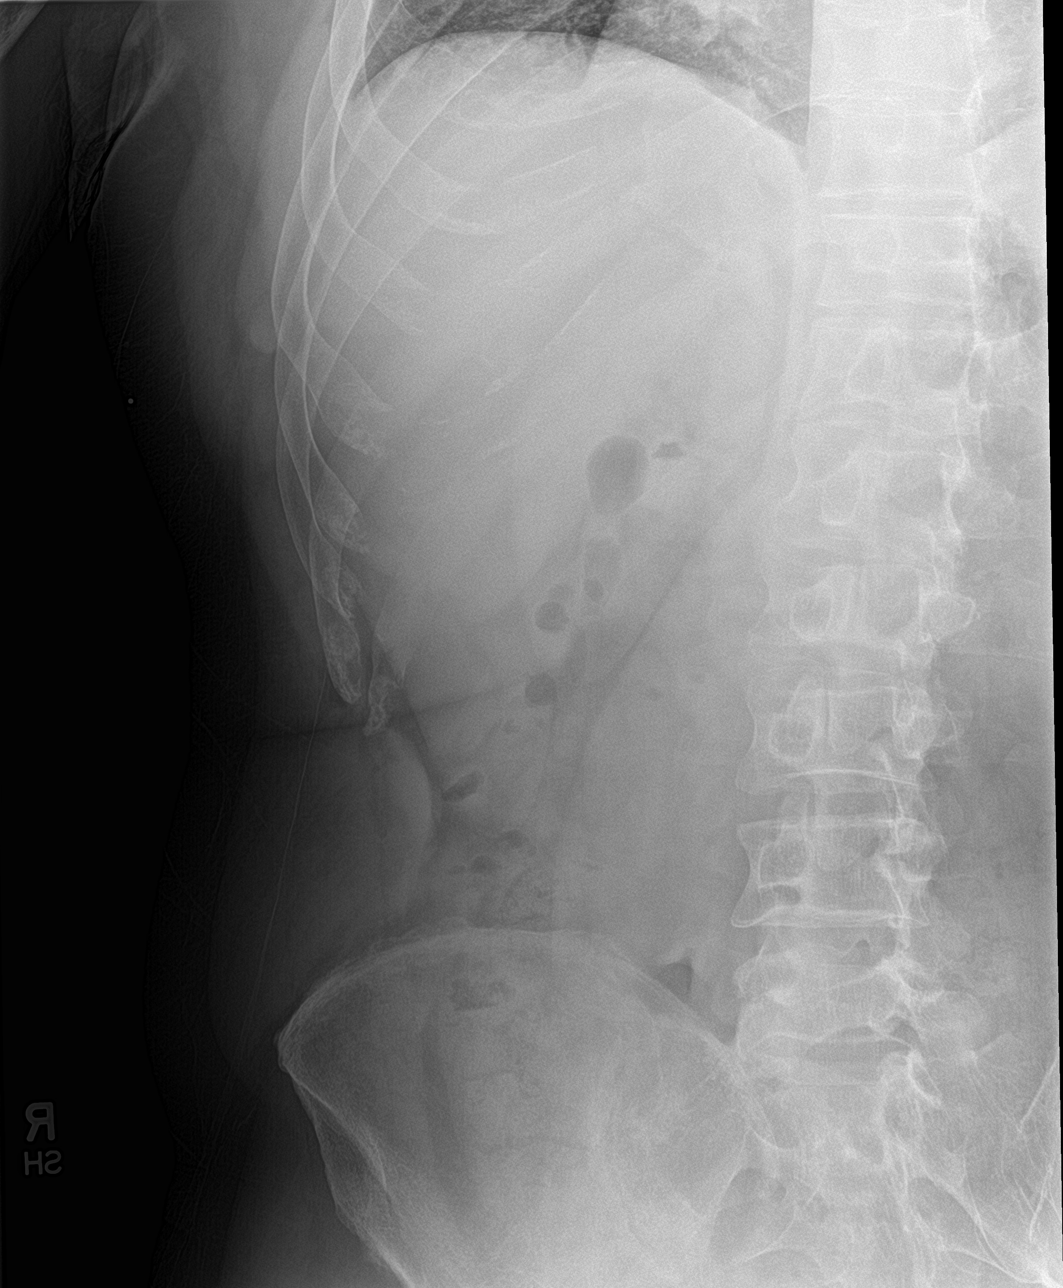

[3 of 3 positions shown; findings below may reference images not displayed]

FINDINGS: No fracture or other bone lesions are seen involving the ribs. There
is no evidence of pneumothorax or pleural effusion. Both lungs are
clear. Heart size and mediastinal contours are within normal limits.
IMPRESSION: Negative.

## 2019-09-10 DIAGNOSIS — D225 Melanocytic nevi of trunk: Secondary | ICD-10-CM | POA: Diagnosis not present

## 2019-09-10 DIAGNOSIS — D2262 Melanocytic nevi of left upper limb, including shoulder: Secondary | ICD-10-CM | POA: Diagnosis not present

## 2019-09-10 DIAGNOSIS — D2372 Other benign neoplasm of skin of left lower limb, including hip: Secondary | ICD-10-CM | POA: Diagnosis not present

## 2019-09-10 DIAGNOSIS — L918 Other hypertrophic disorders of the skin: Secondary | ICD-10-CM | POA: Diagnosis not present

## 2019-09-10 DIAGNOSIS — D235 Other benign neoplasm of skin of trunk: Secondary | ICD-10-CM | POA: Diagnosis not present

## 2019-09-10 DIAGNOSIS — L700 Acne vulgaris: Secondary | ICD-10-CM | POA: Diagnosis not present

## 2019-09-10 DIAGNOSIS — L308 Other specified dermatitis: Secondary | ICD-10-CM | POA: Diagnosis not present

## 2019-09-10 DIAGNOSIS — D2371 Other benign neoplasm of skin of right lower limb, including hip: Secondary | ICD-10-CM | POA: Diagnosis not present

## 2019-09-10 DIAGNOSIS — D2222 Melanocytic nevi of left ear and external auricular canal: Secondary | ICD-10-CM | POA: Diagnosis not present

## 2019-09-10 MED FILL — TRIAMCINOLONE 0.1% CREAM: 0.1 | 30 days supply | Qty: 45 | Fill #0

## 2019-10-16 DIAGNOSIS — H6990 Unspecified Eustachian tube disorder, unspecified ear: Secondary | ICD-10-CM | POA: Diagnosis not present

## 2019-10-16 DIAGNOSIS — H9319 Tinnitus, unspecified ear: Secondary | ICD-10-CM | POA: Diagnosis not present

## 2019-10-16 DIAGNOSIS — M26609 Unspecified temporomandibular joint disorder, unspecified side: Secondary | ICD-10-CM | POA: Diagnosis not present

## 2019-10-16 DIAGNOSIS — E663 Overweight: Secondary | ICD-10-CM | POA: Diagnosis not present

## 2019-10-18 DIAGNOSIS — M459 Ankylosing spondylitis of unspecified sites in spine: Secondary | ICD-10-CM | POA: Diagnosis not present

## 2019-10-24 DIAGNOSIS — H5213 Myopia, bilateral: Secondary | ICD-10-CM | POA: Diagnosis not present

## 2019-11-09 ENCOUNTER — Other Ambulatory Visit: Payer: Self-pay

## 2019-11-09 ENCOUNTER — Emergency Department (INDEPENDENT_AMBULATORY_CARE_PROVIDER_SITE_OTHER)
Admission: EM | Admit: 2019-11-09 | Discharge: 2019-11-09 | Disposition: A | Discharge status: Home / Self Care | Payer: No Typology Code available for payment source | Source: Home / Self Care | Attending: Family Medicine | Admitting: Family Medicine

## 2019-11-09 ENCOUNTER — Encounter: Payer: Self-pay | Admitting: Emergency Medicine

## 2019-11-09 DIAGNOSIS — W57XXXA Bitten or stung by nonvenomous insect and other nonvenomous arthropods, initial encounter: Secondary | ICD-10-CM | POA: Diagnosis not present

## 2019-11-09 DIAGNOSIS — S40262A Insect bite (nonvenomous) of left shoulder, initial encounter: Secondary | ICD-10-CM | POA: Diagnosis not present

## 2019-11-09 MED ORDER — DOXYCYCLINE HYCLATE 100 MG PO CAPS: 100.0000 mg | ORAL_CAPSULE | Freq: Two times a day (BID) | ORAL | 0 refills | Status: AC

## 2019-11-09 NOTE — Discharge Instructions (Addendum)
Minimize sun exposure while taking doxycycline

## 2019-11-09 NOTE — ED Provider Notes (Signed)
[facility]    CSN: [id number] Arrival date & time: 11/09/19  1532      History   Chief Complaint Chief Complaint  Patient presents with  . Insect Bite    HPI Ryan Cabrera is a 44 y.o. male.   Patient removed a small non-engorged lone star tick from his left anterior shoulder two days ago.  Today he noticed increased erythema around the site.  He feels well without other symptoms.  The history is provided by the patient.    Past Medical History:  Diagnosis Date  . Ankylosing spondylitis ([place])   . B12 deficiency    history of  . Chicken pox    childhood  . Depression   . Genital warts   . Hematochezia    neg workup including EGD and colonoscopy    Patient Active Problem List   Diagnosis Date Noted  . Shoulder pain 03/26/2012  . Encounter for long-term (current) use of other medications 03/26/2012  . Cerumen impaction 03/26/2012  . Pelvic pain 09/05/2011  . Otitis externa 02/06/2011  . Sea sickness 02/06/2011  . VITAMIN B12 DEFICIENCY 03/06/2010  . SKIN LESION 03/06/2010  . OTHER NONSPECIFIC FINDINGS EXAMINATION OF BLOOD 03/06/2010  . PLANTAR WART, LEFT 01/16/2009  . ANXIETY DISORDER 01/30/2008  . DEPRESSION 01/30/2008  . BENIGN POSITIONAL VERTIGO 01/30/2008  . ANKYLOSING SPONDYLITIS 01/30/2008  . ELEVATED BLOOD PRESSURE WITHOUT DIAGNOSIS OF HYPERTENSION 01/30/2008    History reviewed. No pertinent surgical history.     Home Medications    Prior to Admission medications   Medication Sig Start Date End Date Taking? Authorizing Provider  clonazePAM (KLONOPIN) 0.5 MG tablet Take 0.5 mg by mouth as needed.      [provider]  doxycycline (VIBRAMYCIN) 100 MG capsule Take 1 capsule (100 mg total) by mouth 2 (two) times daily. Take with food. 11/09/19   Kandra Nicolas, MD  ETODOLAC PO Take by mouth. As needed for ankylosing spondilitis.     [provider]  fish oil-omega-3 fatty acids 1000 MG capsule Take 2 tablets daily.     [provider]  glucosamine-chondroitin 500-400 MG tablet Take 2 tablets daily.     [provider]  ibuprofen (ADVIL,MOTRIN) 800 MG tablet Take 1 tablet (800 mg total) by mouth 3 (three) times daily. 11/16/17   Robyn Haber, MD  meloxicam (MOBIC) 7.5 MG tablet Take 1 twice a day as needed for pain. Take with food. (Do not take with any other NSAID.) 05/22/14   Jacqulyn Cane, MD  Multiple Vitamin (MULTIVITAMIN) tablet Take 1 tablet by mouth daily.      [provider]  neomycin-polymyxin-hydrocortisone (CORTISPORIN) otic solution Place 3 drops into the left ear 4 (four) times daily.      [provider]  omeprazole (PRILOSEC) 20 MG capsule Take 20 mg by mouth daily.      [provider]  vitamin B-12 (CYANOCOBALAMIN) 1000 MCG tablet Take 1,000 mcg by mouth daily.      [provider]  vitamin C (ASCORBIC ACID) 500 MG tablet Take by mouth daily. Take 4-6 tab po daily     [provider]    Family History Family History  Problem Relation Age of Onset  . Ankylosing spondylitis Father   . Alcohol abuse Father   . Hypertension Father   . Diabetes Paternal Grandmother   . Cancer Neg Hx     Social History Social History   Tobacco Use  . Smoking status:  Passive Smoke Exposure - Never Smoker  . Smokeless tobacco: Never Used  . Tobacco comment: smoked for 9 yrs 1.5 packs per day. 1 cigar weekly.  Substance Use Topics  . Alcohol use: Yes    Comment: 2-7 drinks weekly  . Drug use: Not on file     Allergies   Molds & smuts, Sulfasalazine, and Sulfonamide derivatives   Review of Systems Review of Systems  Constitutional: Negative for activity change, chills, diaphoresis, fatigue and fever.  Musculoskeletal: Negative for arthralgias, myalgias and neck stiffness.  Skin: Positive for color change.  Neurological: Negative for headaches.  Hematological: Negative for adenopathy.  All other systems reviewed and are  negative.    Physical Exam Triage Vital Signs ED Triage Vitals  Enc Vitals Group     BP 11/09/19 1610 (!) 156/86     Pulse Rate 11/09/19 1610 (!) 51     Resp 11/09/19 1610 18     Temp 11/09/19 1610 99.1 F (37.3 C)     Temp Source 11/09/19 1610 Oral     SpO2 11/09/19 1610 98 %     Weight 11/09/19 1611 191 lb (86.6 kg)     Height 11/09/19 1611 5\' 11"  (1.803 m)     Head Circumference --      Peak Flow --      Pain Score 11/09/19 1611 0     Pain Loc --      Pain Edu? --      Excl. in [place]? --    No data found.  Updated Vital Signs BP (!) 156/86 (BP Location: Right Arm)   Pulse (!) 51   Temp 99.1 F (37.3 C) (Oral)   Resp 18   Ht 5\' 11"  (1.803 m)   Wt 86.6 kg   SpO2 98%   BMI 26.64 kg/m   Visual Acuity Right Eye Distance:   Left Eye Distance:   Bilateral Distance:    Right Eye Near:   Left Eye Near:    Bilateral Near:     Physical Exam Vitals and nursing note reviewed.  HENT:     Head: Normocephalic.     Right Ear: External ear normal.     Left Ear: External ear normal.     Nose: Nose normal.     Mouth/Throat:     Pharynx: Oropharynx is clear.  Eyes:     Pupils: Pupils are equal, round, and reactive to light.  Cardiovascular:     Rate and Rhythm: Normal rate.  Pulmonary:     Effort: Pulmonary effort is normal.  Musculoskeletal:       Arms:     Right lower leg: No edema.     Left lower leg: No edema.     Comments: Left anterior shoulder has a small erythematous macule surrounded by mild erythema about 5cm diameter without fluctuance or tenderness.  Lymphadenopathy:     Cervical: No cervical adenopathy.  Skin:    General: Skin is warm and dry.     Findings: Rash present.  Neurological:     Mental Status: He is alert.      UC Treatments / Results  Labs (all labs ordered are listed, but only abnormal results are displayed) Labs Reviewed - No data to display  EKG   Radiology No results found.  Procedures Procedures (including critical  care time)  Medications Ordered in UC Medications - No data to display  Initial Impression / Assessment and Plan / UC Course  I have reviewed  the triage vital signs and the nursing notes.  Pertinent labs & imaging results that were available during my care of the patient were reviewed by me and considered in my medical decision making (see chart for details).    Begin doxycycline for 10 days. Followup with Family Doctor if symptoms develop and become worse.   Final Clinical Impressions(s) / UC Diagnoses   Final diagnoses:  Tick bite of left shoulder, initial encounter     Discharge Instructions     Minimize sun exposure while taking doxycycline    ED Prescriptions    Medication Sig Dispense Auth. Provider   doxycycline (VIBRAMYCIN) 100 MG capsule Take 1 capsule (100 mg total) by mouth 2 (two) times daily. Take with food. 20 capsule Kandra Nicolas, MD        Kandra Nicolas, MD 11/12/19 [phone]

## 2019-11-09 NOTE — ED Triage Notes (Signed)
Patient discovered tick on left anterior shoulder 2 days ago; it was not engorged; he removed it himself; today he noticed some redness around the site. No other problems or discomfort. No known exposure to covid positive person.

## 2020-02-14 DIAGNOSIS — R05 Cough: Secondary | ICD-10-CM | POA: Diagnosis not present

## 2020-02-14 DIAGNOSIS — R0982 Postnasal drip: Secondary | ICD-10-CM | POA: Diagnosis not present

## 2020-02-14 DIAGNOSIS — J310 Chronic rhinitis: Secondary | ICD-10-CM | POA: Diagnosis not present

## 2020-02-14 DIAGNOSIS — Z23 Encounter for immunization: Secondary | ICD-10-CM | POA: Diagnosis not present

## 2020-02-14 DIAGNOSIS — H9390 Unspecified disorder of ear, unspecified ear: Secondary | ICD-10-CM | POA: Diagnosis not present

## 2020-02-21 DIAGNOSIS — G5603 Carpal tunnel syndrome, bilateral upper limbs: Secondary | ICD-10-CM | POA: Diagnosis not present

## 2020-02-21 DIAGNOSIS — M459 Ankylosing spondylitis of unspecified sites in spine: Secondary | ICD-10-CM | POA: Diagnosis not present

## 2020-03-07 DIAGNOSIS — J341 Cyst and mucocele of nose and nasal sinus: Secondary | ICD-10-CM | POA: Diagnosis not present

## 2020-03-07 DIAGNOSIS — D164 Benign neoplasm of bones of skull and face: Secondary | ICD-10-CM | POA: Diagnosis not present

## 2020-03-10 DIAGNOSIS — Z0189 Encounter for other specified special examinations: Secondary | ICD-10-CM | POA: Diagnosis not present

## 2020-03-10 DIAGNOSIS — E663 Overweight: Secondary | ICD-10-CM | POA: Diagnosis not present

## 2020-03-12 DIAGNOSIS — H02823 Cysts of right eye, unspecified eyelid: Secondary | ICD-10-CM | POA: Diagnosis not present

## 2020-03-28 DIAGNOSIS — H02823 Cysts of right eye, unspecified eyelid: Secondary | ICD-10-CM | POA: Diagnosis not present

## 2020-03-28 MED FILL — NEO/POLYMYXIN/DEXAMETH DROP: 3.5-10000-0 | 25 days supply | Qty: 5 | Fill #0

## 2020-05-01 DIAGNOSIS — J309 Allergic rhinitis, unspecified: Secondary | ICD-10-CM | POA: Diagnosis not present

## 2020-05-02 DIAGNOSIS — D210 Benign neoplasm of connective and other soft tissue of head, face and neck: Secondary | ICD-10-CM | POA: Diagnosis not present

## 2020-05-02 DIAGNOSIS — D485 Neoplasm of uncertain behavior of skin: Secondary | ICD-10-CM | POA: Diagnosis not present

## 2020-07-24 DIAGNOSIS — J310 Chronic rhinitis: Secondary | ICD-10-CM | POA: Diagnosis not present

## 2020-09-16 ENCOUNTER — Other Ambulatory Visit (HOSPITAL_BASED_OUTPATIENT_CLINIC_OR_DEPARTMENT_OTHER): Payer: Self-pay | Admitting: Dermatology

## 2020-09-16 DIAGNOSIS — D2262 Melanocytic nevi of left upper limb, including shoulder: Secondary | ICD-10-CM | POA: Diagnosis not present

## 2020-09-16 DIAGNOSIS — D2261 Melanocytic nevi of right upper limb, including shoulder: Secondary | ICD-10-CM | POA: Diagnosis not present

## 2020-09-16 DIAGNOSIS — B078 Other viral warts: Secondary | ICD-10-CM | POA: Diagnosis not present

## 2020-09-16 DIAGNOSIS — D2372 Other benign neoplasm of skin of left lower limb, including hip: Secondary | ICD-10-CM | POA: Diagnosis not present

## 2020-09-16 DIAGNOSIS — D225 Melanocytic nevi of trunk: Secondary | ICD-10-CM | POA: Diagnosis not present

## 2020-09-16 DIAGNOSIS — D2271 Melanocytic nevi of right lower limb, including hip: Secondary | ICD-10-CM | POA: Diagnosis not present

## 2020-09-16 DIAGNOSIS — L821 Other seborrheic keratosis: Secondary | ICD-10-CM | POA: Diagnosis not present

## 2020-09-16 DIAGNOSIS — D2371 Other benign neoplasm of skin of right lower limb, including hip: Secondary | ICD-10-CM | POA: Diagnosis not present

## 2020-09-16 DIAGNOSIS — L700 Acne vulgaris: Secondary | ICD-10-CM | POA: Diagnosis not present

## 2020-09-16 MED FILL — CLINDAMYCIN PHOSPHATE 1 % S: 1 | 30 days supply | Qty: 60 | Fill #0

## 2021-02-03 DIAGNOSIS — M810 Age-related osteoporosis without current pathological fracture: Secondary | ICD-10-CM | POA: Diagnosis not present

## 2021-02-03 DIAGNOSIS — J309 Allergic rhinitis, unspecified: Secondary | ICD-10-CM | POA: Diagnosis not present

## 2021-02-03 DIAGNOSIS — E663 Overweight: Secondary | ICD-10-CM | POA: Diagnosis not present

## 2021-02-03 DIAGNOSIS — E559 Vitamin D deficiency, unspecified: Secondary | ICD-10-CM | POA: Diagnosis not present

## 2021-03-04 DIAGNOSIS — H524 Presbyopia: Secondary | ICD-10-CM | POA: Diagnosis not present

## 2021-03-10 DIAGNOSIS — M533 Sacrococcygeal disorders, not elsewhere classified: Secondary | ICD-10-CM | POA: Diagnosis not present

## 2021-03-10 DIAGNOSIS — K429 Umbilical hernia without obstruction or gangrene: Secondary | ICD-10-CM | POA: Diagnosis not present

## 2021-04-14 DIAGNOSIS — K429 Umbilical hernia without obstruction or gangrene: Secondary | ICD-10-CM | POA: Diagnosis not present

## 2021-04-14 DIAGNOSIS — R109 Unspecified abdominal pain: Secondary | ICD-10-CM | POA: Diagnosis not present

## 2021-06-29 DIAGNOSIS — L659 Nonscarring hair loss, unspecified: Secondary | ICD-10-CM | POA: Diagnosis not present

## 2021-07-01 DIAGNOSIS — L649 Androgenic alopecia, unspecified: Secondary | ICD-10-CM | POA: Diagnosis not present

## 2021-07-03 ENCOUNTER — Other Ambulatory Visit (HOSPITAL_BASED_OUTPATIENT_CLINIC_OR_DEPARTMENT_OTHER): Payer: Self-pay

## 2021-07-03 MED ORDER — FINASTERIDE 1 MG PO TABS
ORAL_TABLET | ORAL | 3 refills | Status: AC
  Filled 2021-07-03: qty 90, 90d supply, fill #0
  Filled 2021-09-28: qty 90, 90d supply, fill #1
  Filled 2021-12-31: qty 90, 90d supply, fill #2
  Filled 2022-06-25: qty 90, 90d supply, fill #3

## 2021-07-23 DIAGNOSIS — R0981 Nasal congestion: Secondary | ICD-10-CM | POA: Diagnosis not present

## 2021-07-23 DIAGNOSIS — G4733 Obstructive sleep apnea (adult) (pediatric): Secondary | ICD-10-CM | POA: Diagnosis not present

## 2021-07-23 DIAGNOSIS — J310 Chronic rhinitis: Secondary | ICD-10-CM | POA: Diagnosis not present

## 2021-07-23 DIAGNOSIS — M26603 Bilateral temporomandibular joint disorder, unspecified: Secondary | ICD-10-CM | POA: Diagnosis not present

## 2021-08-27 DIAGNOSIS — G5600 Carpal tunnel syndrome, unspecified upper limb: Secondary | ICD-10-CM | POA: Diagnosis not present

## 2021-08-27 DIAGNOSIS — F17290 Nicotine dependence, other tobacco product, uncomplicated: Secondary | ICD-10-CM | POA: Diagnosis not present

## 2021-08-27 DIAGNOSIS — M459 Ankylosing spondylitis of unspecified sites in spine: Secondary | ICD-10-CM | POA: Diagnosis not present

## 2021-09-28 ENCOUNTER — Other Ambulatory Visit (HOSPITAL_BASED_OUTPATIENT_CLINIC_OR_DEPARTMENT_OTHER): Payer: Self-pay

## 2021-10-05 DIAGNOSIS — G4733 Obstructive sleep apnea (adult) (pediatric): Secondary | ICD-10-CM | POA: Diagnosis not present

## 2021-10-13 DIAGNOSIS — D225 Melanocytic nevi of trunk: Secondary | ICD-10-CM | POA: Diagnosis not present

## 2021-10-13 DIAGNOSIS — D2271 Melanocytic nevi of right lower limb, including hip: Secondary | ICD-10-CM | POA: Diagnosis not present

## 2021-10-13 DIAGNOSIS — D2262 Melanocytic nevi of left upper limb, including shoulder: Secondary | ICD-10-CM | POA: Diagnosis not present

## 2021-10-13 DIAGNOSIS — D235 Other benign neoplasm of skin of trunk: Secondary | ICD-10-CM | POA: Diagnosis not present

## 2021-10-13 DIAGNOSIS — L821 Other seborrheic keratosis: Secondary | ICD-10-CM | POA: Diagnosis not present

## 2021-12-31 ENCOUNTER — Other Ambulatory Visit (HOSPITAL_BASED_OUTPATIENT_CLINIC_OR_DEPARTMENT_OTHER): Payer: Self-pay

## 2022-02-05 DIAGNOSIS — Z0189 Encounter for other specified special examinations: Secondary | ICD-10-CM | POA: Diagnosis not present

## 2022-02-11 DIAGNOSIS — R002 Palpitations: Secondary | ICD-10-CM | POA: Diagnosis not present

## 2022-02-11 DIAGNOSIS — R001 Bradycardia, unspecified: Secondary | ICD-10-CM | POA: Diagnosis not present

## 2022-02-11 DIAGNOSIS — R0789 Other chest pain: Secondary | ICD-10-CM | POA: Diagnosis not present

## 2022-02-11 DIAGNOSIS — R079 Chest pain, unspecified: Secondary | ICD-10-CM | POA: Diagnosis not present

## 2022-02-16 DIAGNOSIS — M4802 Spinal stenosis, cervical region: Secondary | ICD-10-CM | POA: Diagnosis not present

## 2022-02-16 DIAGNOSIS — M459 Ankylosing spondylitis of unspecified sites in spine: Secondary | ICD-10-CM | POA: Diagnosis not present

## 2022-02-16 DIAGNOSIS — M461 Sacroiliitis, not elsewhere classified: Secondary | ICD-10-CM | POA: Diagnosis not present

## 2022-02-16 DIAGNOSIS — Z7409 Other reduced mobility: Secondary | ICD-10-CM | POA: Diagnosis not present

## 2022-03-01 DIAGNOSIS — R001 Bradycardia, unspecified: Secondary | ICD-10-CM | POA: Diagnosis not present

## 2022-03-03 DIAGNOSIS — R079 Chest pain, unspecified: Secondary | ICD-10-CM | POA: Diagnosis not present

## 2022-06-14 DIAGNOSIS — R002 Palpitations: Secondary | ICD-10-CM | POA: Diagnosis not present

## 2022-06-25 ENCOUNTER — Other Ambulatory Visit (HOSPITAL_BASED_OUTPATIENT_CLINIC_OR_DEPARTMENT_OTHER): Payer: Self-pay

## 2023-01-07 ENCOUNTER — Other Ambulatory Visit (HOSPITAL_BASED_OUTPATIENT_CLINIC_OR_DEPARTMENT_OTHER): Payer: Self-pay

## 2023-01-07 MED ORDER — FINASTERIDE 1 MG PO TABS
1.0000 mg | ORAL_TABLET | Freq: Every day | ORAL | 3 refills | Status: DC
  Filled 2023-01-07: qty 90, 90d supply, fill #0

## 2023-01-08 ENCOUNTER — Other Ambulatory Visit (HOSPITAL_BASED_OUTPATIENT_CLINIC_OR_DEPARTMENT_OTHER): Payer: Self-pay

## 2023-01-10 ENCOUNTER — Other Ambulatory Visit (HOSPITAL_BASED_OUTPATIENT_CLINIC_OR_DEPARTMENT_OTHER): Payer: Self-pay

## 2023-01-10 MED ORDER — FINASTERIDE 1 MG PO TABS
1.0000 mg | ORAL_TABLET | Freq: Every day | ORAL | 3 refills | Status: AC
  Filled 2023-01-10: qty 90, 90d supply, fill #0
  Filled 2023-06-27: qty 90, 90d supply, fill #1
  Filled 2023-09-22: qty 90, 90d supply, fill #2
  Filled 2023-12-27: qty 90, 90d supply, fill #3

## 2023-01-25 ENCOUNTER — Other Ambulatory Visit (HOSPITAL_BASED_OUTPATIENT_CLINIC_OR_DEPARTMENT_OTHER): Payer: Self-pay

## 2023-01-25 DIAGNOSIS — D2371 Other benign neoplasm of skin of right lower limb, including hip: Secondary | ICD-10-CM | POA: Diagnosis not present

## 2023-01-25 DIAGNOSIS — B078 Other viral warts: Secondary | ICD-10-CM | POA: Diagnosis not present

## 2023-01-25 DIAGNOSIS — D225 Melanocytic nevi of trunk: Secondary | ICD-10-CM | POA: Diagnosis not present

## 2023-01-25 DIAGNOSIS — D2262 Melanocytic nevi of left upper limb, including shoulder: Secondary | ICD-10-CM | POA: Diagnosis not present

## 2023-01-25 DIAGNOSIS — L245 Irritant contact dermatitis due to other chemical products: Secondary | ICD-10-CM | POA: Diagnosis not present

## 2023-01-25 DIAGNOSIS — D2222 Melanocytic nevi of left ear and external auricular canal: Secondary | ICD-10-CM | POA: Diagnosis not present

## 2023-01-25 DIAGNOSIS — D235 Other benign neoplasm of skin of trunk: Secondary | ICD-10-CM | POA: Diagnosis not present

## 2023-01-25 MED ORDER — TRIAMCINOLONE ACETONIDE 0.1 % EX CREA
1.0000 | TOPICAL_CREAM | Freq: Two times a day (BID) | CUTANEOUS | 1 refills | Status: AC
  Filled 2023-01-25: qty 75, 30d supply, fill #0
  Filled 2023-06-27: qty 75, 60d supply, fill #0
  Filled 2023-09-22: qty 75, 60d supply, fill #1

## 2023-02-09 ENCOUNTER — Other Ambulatory Visit (HOSPITAL_BASED_OUTPATIENT_CLINIC_OR_DEPARTMENT_OTHER): Payer: Self-pay

## 2023-02-10 DIAGNOSIS — E663 Overweight: Secondary | ICD-10-CM | POA: Diagnosis not present

## 2023-02-10 DIAGNOSIS — R001 Bradycardia, unspecified: Secondary | ICD-10-CM | POA: Diagnosis not present

## 2023-02-10 DIAGNOSIS — R0789 Other chest pain: Secondary | ICD-10-CM | POA: Diagnosis not present

## 2023-02-10 DIAGNOSIS — R079 Chest pain, unspecified: Secondary | ICD-10-CM | POA: Diagnosis not present

## 2023-02-17 DIAGNOSIS — I10 Essential (primary) hypertension: Secondary | ICD-10-CM | POA: Diagnosis not present

## 2023-03-04 ENCOUNTER — Other Ambulatory Visit (HOSPITAL_BASED_OUTPATIENT_CLINIC_OR_DEPARTMENT_OTHER): Payer: Self-pay

## 2023-03-22 DIAGNOSIS — H5213 Myopia, bilateral: Secondary | ICD-10-CM | POA: Diagnosis not present

## 2023-03-22 DIAGNOSIS — M459 Ankylosing spondylitis of unspecified sites in spine: Secondary | ICD-10-CM | POA: Diagnosis not present

## 2023-03-22 DIAGNOSIS — H52202 Unspecified astigmatism, left eye: Secondary | ICD-10-CM | POA: Diagnosis not present

## 2023-03-22 DIAGNOSIS — H524 Presbyopia: Secondary | ICD-10-CM | POA: Diagnosis not present

## 2023-04-01 DIAGNOSIS — M85872 Other specified disorders of bone density and structure, left ankle and foot: Secondary | ICD-10-CM | POA: Diagnosis not present

## 2023-04-01 DIAGNOSIS — M79672 Pain in left foot: Secondary | ICD-10-CM | POA: Diagnosis not present

## 2023-04-01 DIAGNOSIS — M79675 Pain in left toe(s): Secondary | ICD-10-CM | POA: Diagnosis not present

## 2023-04-12 DIAGNOSIS — M25512 Pain in left shoulder: Secondary | ICD-10-CM | POA: Diagnosis not present

## 2023-04-12 DIAGNOSIS — Z7409 Other reduced mobility: Secondary | ICD-10-CM | POA: Diagnosis not present

## 2023-04-12 DIAGNOSIS — R531 Weakness: Secondary | ICD-10-CM | POA: Diagnosis not present

## 2023-06-28 ENCOUNTER — Other Ambulatory Visit (HOSPITAL_BASED_OUTPATIENT_CLINIC_OR_DEPARTMENT_OTHER): Payer: Self-pay

## 2023-06-29 ENCOUNTER — Other Ambulatory Visit (HOSPITAL_BASED_OUTPATIENT_CLINIC_OR_DEPARTMENT_OTHER): Payer: Self-pay

## 2023-09-23 ENCOUNTER — Other Ambulatory Visit (HOSPITAL_BASED_OUTPATIENT_CLINIC_OR_DEPARTMENT_OTHER): Payer: Self-pay

## 2023-12-27 ENCOUNTER — Other Ambulatory Visit (HOSPITAL_BASED_OUTPATIENT_CLINIC_OR_DEPARTMENT_OTHER): Payer: Self-pay

## 2024-05-14 ENCOUNTER — Other Ambulatory Visit (HOSPITAL_BASED_OUTPATIENT_CLINIC_OR_DEPARTMENT_OTHER): Payer: Self-pay

## 2024-05-14 DIAGNOSIS — D2262 Melanocytic nevi of left upper limb, including shoulder: Secondary | ICD-10-CM | POA: Diagnosis not present

## 2024-05-14 DIAGNOSIS — D2271 Melanocytic nevi of right lower limb, including hip: Secondary | ICD-10-CM | POA: Diagnosis not present

## 2024-05-14 DIAGNOSIS — D2222 Melanocytic nevi of left ear and external auricular canal: Secondary | ICD-10-CM | POA: Diagnosis not present

## 2024-05-14 DIAGNOSIS — D225 Melanocytic nevi of trunk: Secondary | ICD-10-CM | POA: Diagnosis not present

## 2024-05-14 DIAGNOSIS — D2272 Melanocytic nevi of left lower limb, including hip: Secondary | ICD-10-CM | POA: Diagnosis not present

## 2024-05-14 DIAGNOSIS — D235 Other benign neoplasm of skin of trunk: Secondary | ICD-10-CM | POA: Diagnosis not present

## 2024-05-14 DIAGNOSIS — L738 Other specified follicular disorders: Secondary | ICD-10-CM | POA: Diagnosis not present

## 2024-05-14 DIAGNOSIS — L821 Other seborrheic keratosis: Secondary | ICD-10-CM | POA: Diagnosis not present

## 2024-05-14 DIAGNOSIS — L309 Dermatitis, unspecified: Secondary | ICD-10-CM | POA: Diagnosis not present

## 2024-05-14 DIAGNOSIS — D2371 Other benign neoplasm of skin of right lower limb, including hip: Secondary | ICD-10-CM | POA: Diagnosis not present

## 2024-05-14 MED ORDER — TRIAMCINOLONE ACETONIDE 0.1 % EX CREA
1.0000 | TOPICAL_CREAM | Freq: Two times a day (BID) | CUTANEOUS | 1 refills | Status: AC | PRN
  Filled 2024-05-14: qty 80, 40d supply, fill #0

## 2024-05-24 ENCOUNTER — Other Ambulatory Visit (HOSPITAL_BASED_OUTPATIENT_CLINIC_OR_DEPARTMENT_OTHER): Payer: Self-pay

## 2024-06-08 ENCOUNTER — Other Ambulatory Visit: Payer: Self-pay

## 2024-06-08 ENCOUNTER — Ambulatory Visit
Admission: RE | Admit: 2024-06-08 | Discharge: 2024-06-08 | Disposition: A | Discharge status: Home / Self Care | Attending: Family Medicine | Admitting: Family Medicine

## 2024-06-08 ENCOUNTER — Other Ambulatory Visit (HOSPITAL_BASED_OUTPATIENT_CLINIC_OR_DEPARTMENT_OTHER): Payer: Self-pay

## 2024-06-08 VITALS — BP 157/97 | HR 45 | Temp 99.2°F | Resp 16

## 2024-06-08 DIAGNOSIS — H9313 Tinnitus, bilateral: Secondary | ICD-10-CM | POA: Diagnosis not present

## 2024-06-08 MED ORDER — PREDNISONE 20 MG PO TABS
ORAL_TABLET | ORAL | 0 refills | Status: AC
  Filled 2024-06-08 (×2): qty 11, 7d supply, fill #0

## 2024-06-08 MED ORDER — ALPRAZOLAM 0.25 MG PO TABS
0.2500 mg | ORAL_TABLET | Freq: Two times a day (BID) | ORAL | 0 refills | Status: AC | PRN
  Filled 2024-06-08 (×2): qty 10, 5d supply, fill #0

## 2024-06-08 NOTE — Discharge Instructions (Signed)
 Minimize exposure to loud sound and noises.  Use maximum hearing protection.
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
